# Patient Record
Sex: Female | Born: 1985 | Race: White | Hispanic: No | Marital: Single | Smoking: Current some day smoker
Health system: Southern US, Community
[De-identification: ages and names within clinical notes are randomized; demographics above are authoritative.]

## PROBLEM LIST (undated history)

## (undated) DIAGNOSIS — A6000 Herpesviral infection of urogenital system, unspecified: Secondary | ICD-10-CM

## (undated) DIAGNOSIS — R569 Unspecified convulsions: Secondary | ICD-10-CM

## (undated) DIAGNOSIS — A549 Gonococcal infection, unspecified: Secondary | ICD-10-CM

## (undated) DIAGNOSIS — J302 Other seasonal allergic rhinitis: Secondary | ICD-10-CM

## (undated) DIAGNOSIS — G510 Bell's palsy: Secondary | ICD-10-CM

## (undated) DIAGNOSIS — G40909 Epilepsy, unspecified, not intractable, without status epilepticus: Secondary | ICD-10-CM

## (undated) DIAGNOSIS — A749 Chlamydial infection, unspecified: Secondary | ICD-10-CM

## (undated) DIAGNOSIS — A630 Anogenital (venereal) warts: Secondary | ICD-10-CM

## (undated) HISTORY — DX: Other seasonal allergic rhinitis: J30.2

## (undated) HISTORY — PX: WISDOM TOOTH EXTRACTION: SHX21

## (undated) HISTORY — DX: Chlamydial infection, unspecified: A74.9

## (undated) HISTORY — DX: Bell's palsy: G51.0

## (undated) HISTORY — DX: Gonococcal infection, unspecified: A54.9

## (undated) HISTORY — DX: Anogenital (venereal) warts: A63.0

## (undated) HISTORY — DX: Herpesviral infection of urogenital system, unspecified: A60.00

---
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
## 2006-01-02 ENCOUNTER — Ambulatory Visit: Payer: Self-pay | Admitting: Psychiatry

## 2006-02-06 ENCOUNTER — Ambulatory Visit: Payer: Self-pay | Admitting: Psychiatry

## 2007-02-16 ENCOUNTER — Emergency Department: Payer: Self-pay | Admitting: Emergency Medicine

## 2008-06-05 IMAGING — CT CT HEAD WITHOUT CONTRAST
2 series · 16 of 30 positions shown, 20 images · non-contrast
Comparison: none

REASON FOR EXAM: facial weakness
COMMENTS:

PROCEDURE:     CT  - CT HEAD WITHOUT CONTRAST  - February 16, 2007 [DATE]
RESULT:      No acute intracranial abnormalities are identified.  There is
no evidence of mass lesion or hydrocephalus.  No evidence of hemorrhage.

[Series 2: without · axial · non-contrast · 0.39mm/px · z∈[-46,+74]mm · 13 of 29 slices shown, 17 images]
[im 3/29  brain]
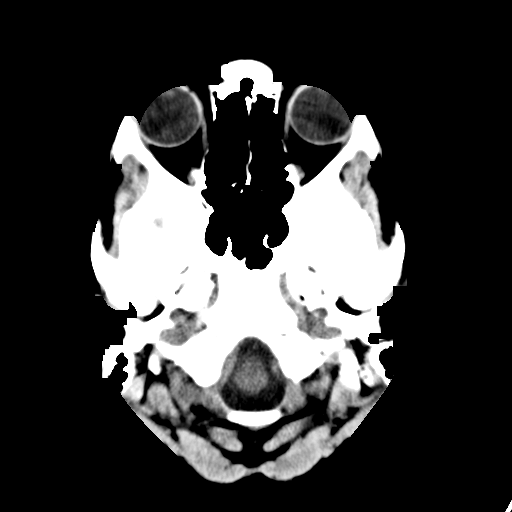
[im 3/29  bone]
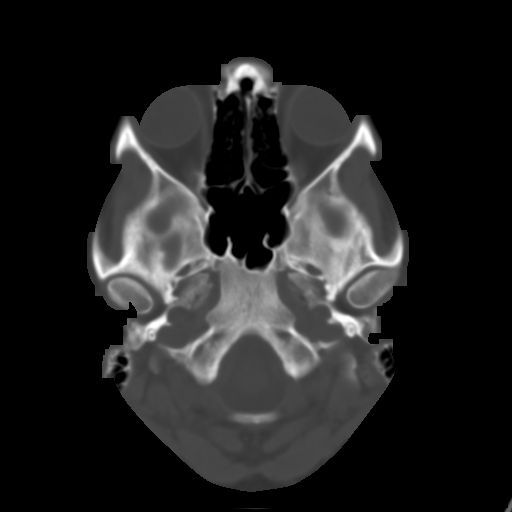
[im 5/29  brain]
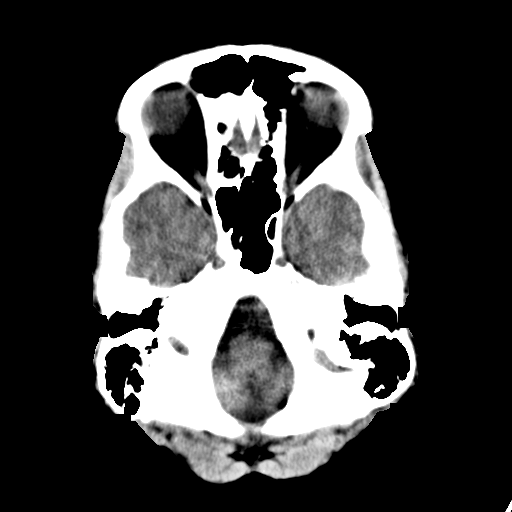
[im 7/29  brain]
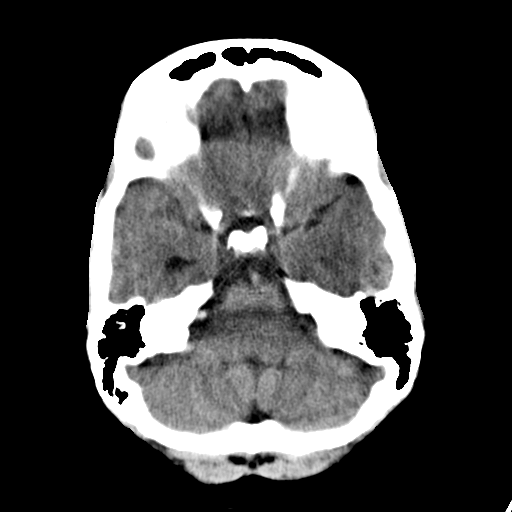
[im 9/29  brain]
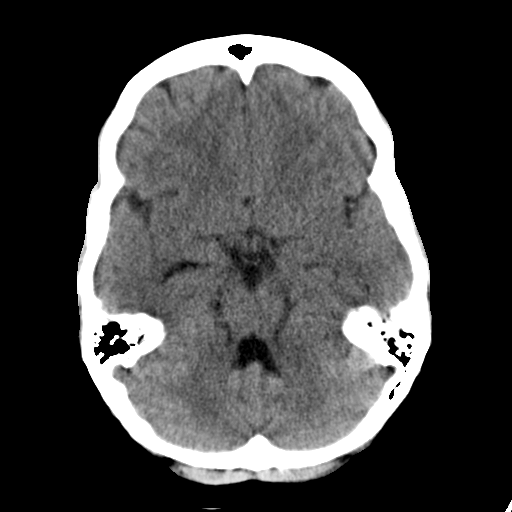
[im 11/29  brain]
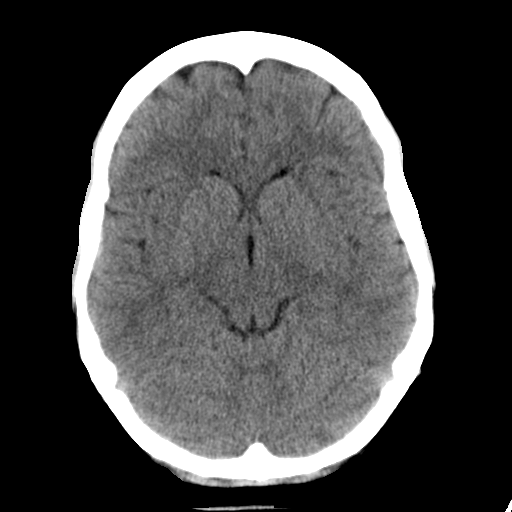
[im 11/29  bone]
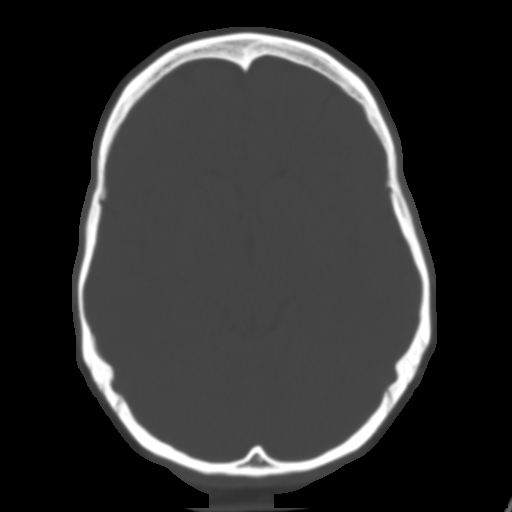
[im 13/29  brain]
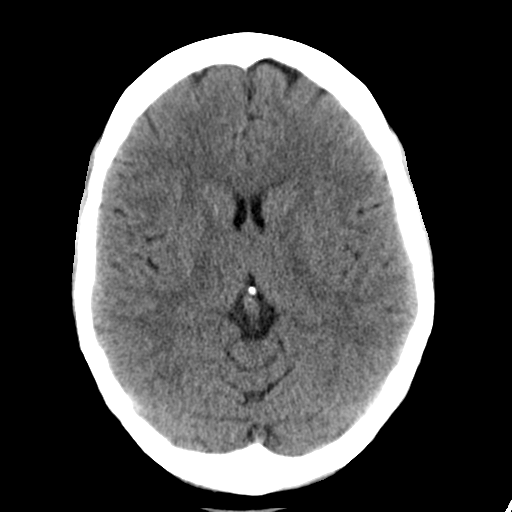
[im 15/29  brain]
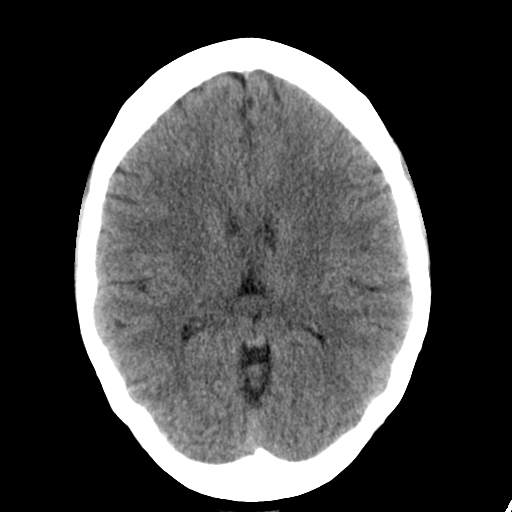
[im 17/29  brain]
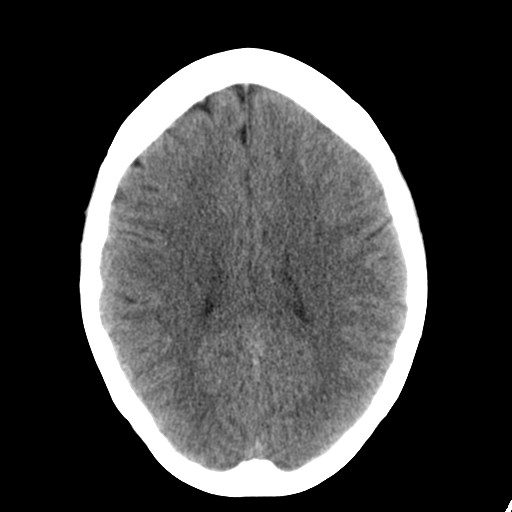
[im 19/29  brain]
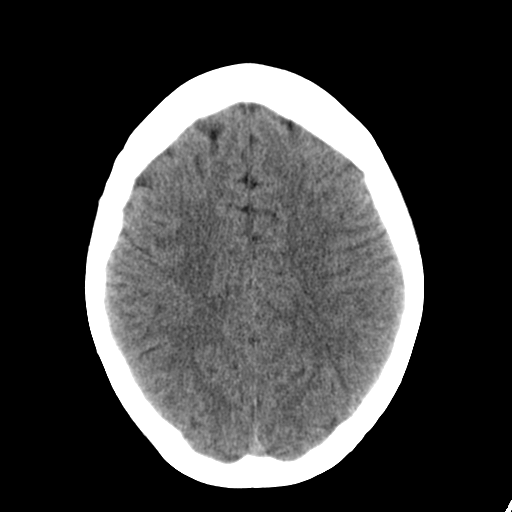
[im 19/29  bone]
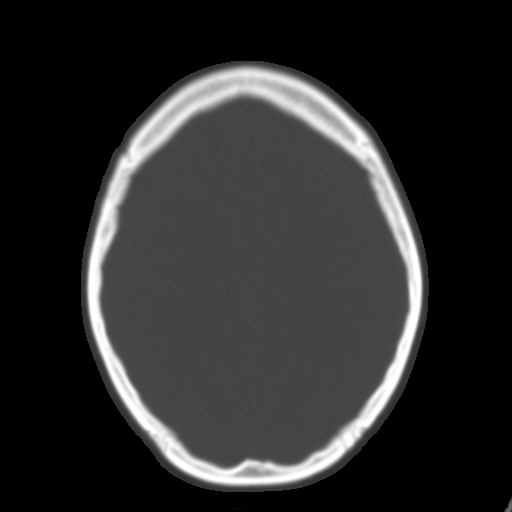
[im 21/29  brain]
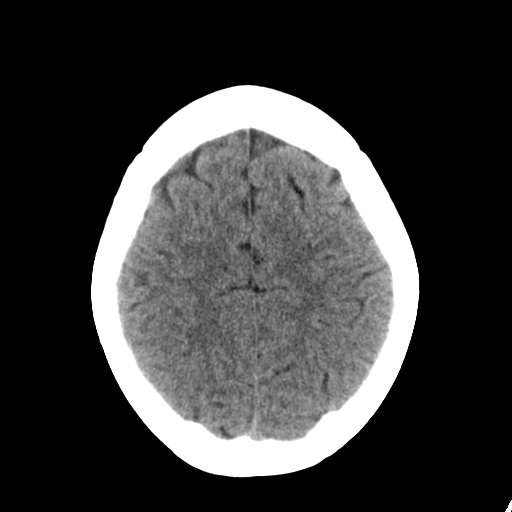
[im 23/29  brain]
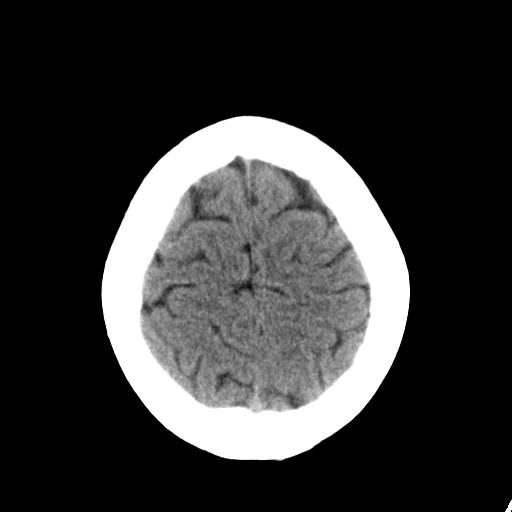
[im 25/29  brain]
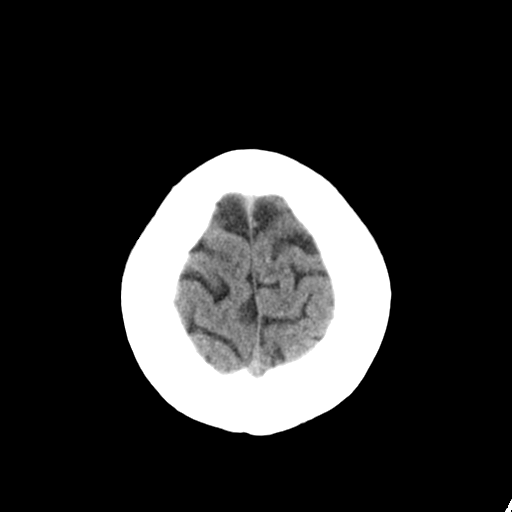
[im 27/29  brain]
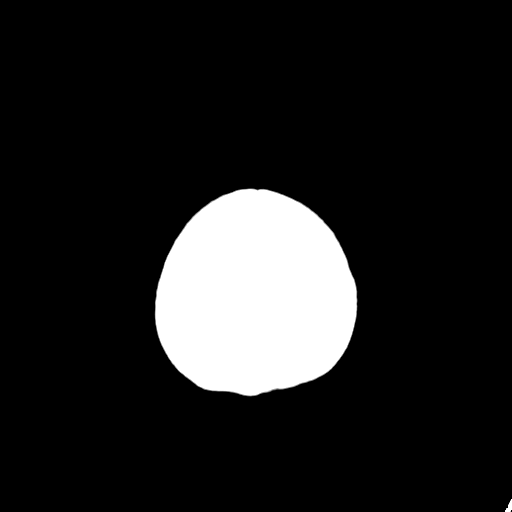
[im 27/29  bone]
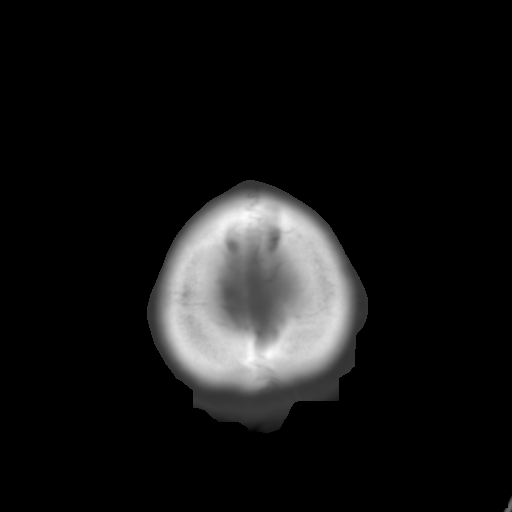

[Series 3: bone · axial · 0.39mm/px · z∈[-46,-6]mm · 3 of 29 slices shown]
[im 3/29  bone]
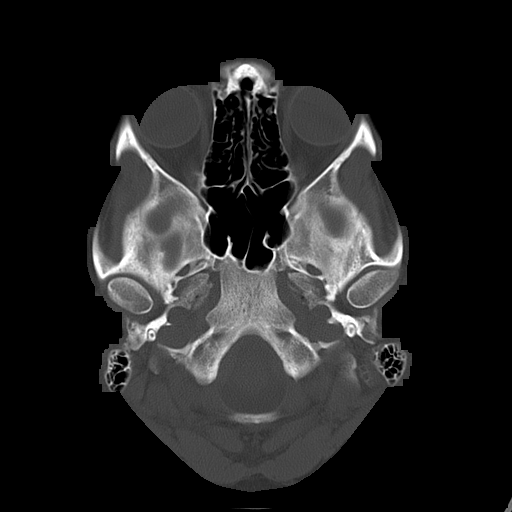
[im 7/29  bone]
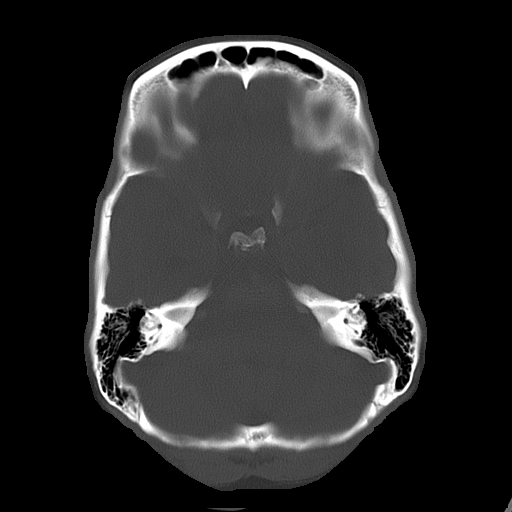
[im 11/29  bone]
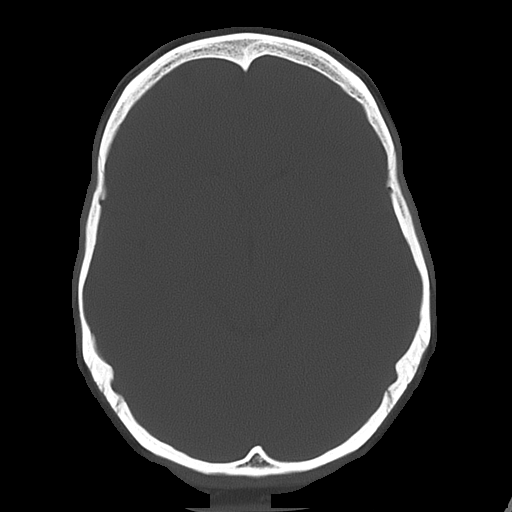

[16 of 30 positions shown; findings below may reference images not displayed]

IMPRESSION: No acute abnormalities.

## 2014-08-06 ENCOUNTER — Encounter (HOSPITAL_COMMUNITY): Payer: Self-pay | Admitting: Emergency Medicine

## 2014-08-06 ENCOUNTER — Emergency Department (HOSPITAL_COMMUNITY)
Admission: EM | Admit: 2014-08-06 | Discharge: 2014-08-07 | Disposition: A | Discharge status: Home / Self Care | Payer: Self-pay | Attending: Emergency Medicine | Admitting: Emergency Medicine

## 2014-08-06 DIAGNOSIS — G40909 Epilepsy, unspecified, not intractable, without status epilepticus: Secondary | ICD-10-CM | POA: Insufficient documentation

## 2014-08-06 DIAGNOSIS — R569 Unspecified convulsions: Secondary | ICD-10-CM | POA: Insufficient documentation

## 2014-08-06 DIAGNOSIS — F17200 Nicotine dependence, unspecified, uncomplicated: Secondary | ICD-10-CM | POA: Insufficient documentation

## 2014-08-06 DIAGNOSIS — F29 Unspecified psychosis not due to a substance or known physiological condition: Secondary | ICD-10-CM | POA: Insufficient documentation

## 2014-08-06 DIAGNOSIS — R51 Headache: Secondary | ICD-10-CM | POA: Insufficient documentation

## 2014-08-06 DIAGNOSIS — Z880 Allergy status to penicillin: Secondary | ICD-10-CM | POA: Insufficient documentation

## 2014-08-06 HISTORY — DX: Unspecified convulsions: R56.9

## 2014-08-06 HISTORY — DX: Epilepsy, unspecified, not intractable, without status epilepticus: G40.909

## 2014-08-06 LAB — BASIC METABOLIC PANEL
ANION GAP: 15 (ref 5–15)
BUN: 17 mg/dL (ref 6–23)
CO2: 23 mEq/L (ref 19–32)
Calcium: 9.7 mg/dL (ref 8.4–10.5)
Chloride: 101 mEq/L (ref 96–112)
Creatinine, Ser: 0.96 mg/dL (ref 0.50–1.10)
GFR, EST NON AFRICAN AMERICAN: 79 mL/min — AB (ref >90)
Glucose, Bld: 81 mg/dL (ref 70–99)
POTASSIUM: 4.7 meq/L (ref 3.7–5.3)
Sodium: 139 mEq/L (ref 137–147)

## 2014-08-06 MED ORDER — ACETAMINOPHEN 325 MG PO TABS
650.0000 mg | ORAL_TABLET | Freq: Once | ORAL | Status: AC
  Administered 2014-08-06: 650 mg via ORAL
  Filled 2014-08-06: qty 2

## 2014-08-06 NOTE — Discharge Instructions (Signed)
Please read and follow all provided instructions.  Your diagnoses today include:  1. Seizure     Tests performed today include:  Electrolytes, kidney function, and blood counts  Vital signs. See below for your results today.   Medications prescribed:   None  Take any prescribed medications only as directed.  Home care instructions:  Follow any educational materials contained in this packet.  You can not drive until cleared by a neurologist.   Follow-up instructions: Please follow-up with your primary care provider and your neurologist in the next week for further evaluation of your symptoms.   Return instructions:   Please return to the Emergency Department if you experience worsening symptoms.   Return with another seizure, severe worsening headache. Return if you have weakness in your arms or legs, slurred speech, trouble walking or talking, confusion, or trouble with your balance.   Please return if you have any other emergent concerns.  Additional Information:  Your vital signs today were: BP 97/67   Pulse 80   Temp(Src) 97.7 F (36.5 C) (Oral)   Resp 16   Ht 5\' 6"  (1.676 m)   Wt 153 lb (69.4 kg)   BMI 24.71 kg/m2   SpO2 100%   LMP 07/27/2014 If your blood pressure (BP) was elevated above 135/85 this visit, please have this repeated by your doctor within one month. --------------

## 2014-08-06 NOTE — ED Provider Notes (Signed)
CSN: [id number]     Arrival date & time 08/06/14  1912 History   First MD Initiated Contact with Patient 08/06/14 1955     Chief Complaint  Patient presents with  . Seizures     (Consider location/radiation/quality/duration/timing/severity/associated sxs/prior Treatment) HPI Comments: Patients with history of seizure in 2000 and again in 2004, antiepileptic use until approximately 2009 -- presents with probable seizure (at 6:45PM). These seizures were triggered by bright flashing lights. Patient was diagnosed in [place] until 2005 and subsequently followed in [place] until 2009. Patient was with her friends today when she became lightheaded and had tunnel vision. She lost consciousness. Per her friends, patient began to shake for about 30 seconds over her entire body. They lowered her safely to the ground and protected her from other injury. Patient woke up approximately 30 seconds later but was slower to respond and mildly confused for 10-15 minutes. No tongue biting or incontinence. She was brought immediately to the emergency department. Patient is currently on no medications. She drank approximately a glass and a half of wine today. She does not drink every day. She denies benzodiazepine use. She has had chronic headaches recently that she attributes to stress. No nausea or vomiting. No fever or neck pain. No treatments prior to arrival. Patient feels back to her baseline.  Patient is a 28 y.o. female presenting with seizures. The history is provided by the patient.  Seizures   Past Medical History  Diagnosis Date  . Seizures   . Epilepsy 2004    In [place]   Past Surgical History  Procedure Laterality Date  . Wisdom tooth extraction     History reviewed. No pertinent family history. History  Substance Use Topics  . Smoking status: Current Every Day Smoker  . Smokeless tobacco: Not on file  . Alcohol Use: Yes   OB History   Grav Para Term Preterm Abortions TAB SAB Ect Mult Living               Review of Systems  Constitutional: Negative for fatigue.  HENT: Negative for tinnitus.   Eyes: Negative for photophobia, pain and visual disturbance.  Respiratory: Negative for shortness of breath.   Cardiovascular: Negative for chest pain.  Gastrointestinal: Negative for nausea and vomiting.  Musculoskeletal: Negative for back pain, gait problem and neck pain.  Skin: Negative for wound.  Neurological: Positive for seizures and headaches. Negative for dizziness, weakness, light-headedness and numbness.  Psychiatric/Behavioral: Positive for confusion. Negative for decreased concentration.   Allergies  Penicillins  Home Medications   Prior to Admission medications   Not on File   BP 97/67  Pulse 80  Temp(Src) 97.7 F (36.5 C) (Oral)  Resp 16  Ht 5\' 6"  (1.676 m)  Wt 153 lb (69.4 kg)  BMI 24.71 kg/m2  SpO2 100%  LMP 07/27/2014  Physical Exam  Nursing note and vitals reviewed. Constitutional: She is oriented to person, place, and time. She appears well-developed and well-nourished.  HENT:  Head: Normocephalic and atraumatic.  Right Ear: Tympanic membrane, external ear and ear canal normal.  Left Ear: Tympanic membrane, external ear and ear canal normal.  Nose: Nose normal.  Mouth/Throat: Uvula is midline, oropharynx is clear and moist and mucous membranes are normal.  Eyes: Conjunctivae, EOM and lids are normal. Pupils are equal, round, and reactive to light. Right eye exhibits no nystagmus. Left eye exhibits no nystagmus.  Neck: Normal range of motion. Neck supple.  Cardiovascular: Normal rate and regular rhythm.  No murmur heard. Pulmonary/Chest: Effort normal and breath sounds normal. No respiratory distress. She has no wheezes. She has no rales.  Abdominal: Soft. There is no tenderness. There is no rebound and no guarding.  Musculoskeletal: Normal range of motion. She exhibits no edema and no tenderness.       Cervical back: She exhibits normal range of  motion, no tenderness and no bony tenderness.  Neurological: She is alert and oriented to person, place, and time. She has normal strength and normal reflexes. No cranial nerve deficit or sensory deficit. She displays a negative Romberg sign. Coordination and gait normal. GCS eye subscore is 4. GCS verbal subscore is 5. GCS motor subscore is 6.  Skin: Skin is warm and dry.  Psychiatric: She has a normal mood and affect.    ED Course  Procedures (including critical care time) Labs Review Labs Reviewed  BASIC METABOLIC PANEL - Abnormal; Notable for the following:    GFR calc non Af Amer 79 (*)    All other components within normal limits    Imaging Review No results found.   EKG Interpretation None      8:21 PM Patient seen and examined. Work-up initiated. Medications ordered. Patient was discussed with Elwin Mocha, MD   Vital signs reviewed and are as follows: BP 97/67  Pulse 80  Temp(Src) 97.7 F (36.5 C) (Oral)  Resp 16  Ht 5\' 6"  (1.676 m)  Wt 153 lb (69.4 kg)  BMI 24.71 kg/m2  SpO2 100%  LMP 07/27/2014  10:12 PM Patient stable. No further seizure activity. Discussed driving restrictions and that she should not drive.   Patient will followup with urologist. She has one in [place], [place] neuro referral given as well.  Patient urged to return with further seizure activity. Patient counseled to return if they have weakness in their arms or legs, worsening severe HA, slurred speech, trouble walking or talking, confusion, trouble with their balance, or if they have any other concerns. Patient verbalizes understanding and agrees with plan.   Will not start on antiepileptics given isolated seizure.    MDM   Final diagnoses:  Seizure   Pt with history of seizure -- however none for about 10 years. Her history today is consistent with seizure. She was postictal. Normal neuro exam. Electrolytes normal. Will defer further work-up and treatment to neurologist. Do not feel  emergent neuro consultation indicated tonight.   No dangerous or life-threatening conditions suspected or identified by history, physical exam, and by work-up. No indications for hospitalization identified.      Renne Crigler, PA-C 08/06/14 2217

## 2014-08-06 NOTE — ED Provider Notes (Signed)
Medical screening examination/treatment/procedure(s) were performed by non-physician practitioner and as supervising physician I was immediately available for consultation/collaboration.   EKG Interpretation None        Harrison Paulson, MD 08/06/14 2352 

## 2014-08-06 NOTE — ED Notes (Signed)
Per pt and her friends they were downtown when pt states she got lightheaded, began to have tunnel vision and got very weak.  Her friends helped her to the ground.  They stated she was shaking all over for approximately 30 seconds, for 15-20 seconds after pt's eyes were glazed and she was slurring her speech.  Pt is now speaking in full sentences, is A&O x4.  Pt has hx of seizures, no official dx of epilepsy.  Pt has not been taking seizure medication in greater than five years.

## 2018-02-17 ENCOUNTER — Ambulatory Visit (INDEPENDENT_AMBULATORY_CARE_PROVIDER_SITE_OTHER): Payer: BLUE CROSS/BLUE SHIELD | Admitting: Obstetrics and Gynecology

## 2018-02-17 ENCOUNTER — Encounter: Payer: Self-pay | Admitting: Obstetrics and Gynecology

## 2018-02-17 VITALS — BP 120/82 | HR 94 | Ht 66.0 in | Wt 167.0 lb

## 2018-02-17 DIAGNOSIS — T192XXA Foreign body in vulva and vagina, initial encounter: Secondary | ICD-10-CM

## 2018-02-17 NOTE — Progress Notes (Signed)
Chief Complaint  Patient presents with  . Foreign Body in Vagina    only in since last night 02/16/18    HPI:      Ms. Lori Doyle is a 32 y.o. G0P0000 who LMP was Patient's last menstrual period was 02/14/2018 (exact date)., presents today for NP eval of retained tampon. She put it in last night and when removing it this morning, the string came out but the tampon stayed inside. Pt couldn't remove it herself. Has long hx of irreg menses, dysmen and heavy flow. Hasn't had insurance till now for exams/mgmt. Hx of CIN2 per pt report.  FH endometriosis.   Past Medical History:  Diagnosis Date  . Epilepsy (HCC) 2004   [place]  . Seizures (HCC)     Past Surgical History:  Procedure Laterality Date  . WISDOM TOOTH EXTRACTION      History reviewed. No pertinent family history.  Social History   Socioeconomic History  . Marital status: Single    Spouse name: Not on file  . Number of children: Not on file  . Years of education: Not on file  . Highest education level: Not on file  Social Needs  . Financial resource strain: Not on file  . Food insecurity - worry: Not on file  . Food insecurity - inability: Not on file  . Transportation needs - medical: Not on file  . Transportation needs - non-medical: Not on file  Occupational History  . Not on file  Tobacco Use  . Smoking status: Current Every Day Smoker  . Smokeless tobacco: Never Used  Substance and Sexual Activity  . Alcohol use: Yes  . Drug use: Yes    Types: Marijuana  . Sexual activity: Not Currently    Birth control/protection: None  Other Topics Concern  . Not on file  Social History Narrative  . Not on file     Current Outpatient Medications:  .  cetirizine (ZYRTEC) 5 MG tablet, Take 5 mg by mouth daily., Disp: , Rfl:  .  Probiotic Product (PROBIOTIC DAILY PO), Take 1 capsule by mouth every morning., Disp: , Rfl:    ROS:  Review of Systems  Constitutional: Negative for fever.  Gastrointestinal:  Negative for blood in stool, constipation, diarrhea, nausea and vomiting.  Genitourinary: Negative for dyspareunia, dysuria, flank pain, frequency, hematuria, urgency, vaginal bleeding, vaginal discharge and vaginal pain.  Musculoskeletal: Negative for back pain.  Skin: Negative for rash.     OBJECTIVE:   Vitals:  BP 120/82   Pulse 94   Ht 5\' 6"  (1.676 m)   Wt 167 lb (75.8 kg)   LMP 02/14/2018 (Exact Date)   BMI 26.95 kg/m   Physical Exam  Constitutional: She is oriented to person, place, and time and well-developed, well-nourished, and in no distress. Vital signs are normal.  Pulmonary/Chest: Effort normal.  Genitourinary: Vagina normal, uterus normal, cervix normal, right adnexa normal, left adnexa normal and vulva normal. Uterus is not enlarged. Cervix exhibits no motion tenderness and no tenderness. Right adnexum displays no mass and no tenderness. Left adnexum displays no mass and no tenderness. Vulva exhibits no erythema, no exudate, no lesion, no rash and no tenderness. Vagina exhibits no lesion.  Genitourinary Comments: TAMPON IN VAGINA; REMOVED EASILY  Musculoskeletal: Normal range of motion.  Neurological: She is oriented to person, place, and time.  Psychiatric: Memory, affect and judgment normal.  Vitals reviewed.   Assessment/Plan: Retained tampon, initial encounter - Removed successfully. TSS sx discussed.  RTO for annual.     Return if symptoms worsen or fail to improve, for annual.  Alicia B. Copland, PA-C 02/17/2018 7:11 PM

## 2018-02-17 NOTE — Patient Instructions (Signed)
I value your feedback and entrusting us with your care. If you get a [facility] patient survey, I would appreciate you taking the time to let us know about your experience today. Thank you! 

## 2018-03-03 ENCOUNTER — Encounter: Payer: Self-pay | Admitting: Obstetrics and Gynecology

## 2018-03-03 ENCOUNTER — Ambulatory Visit (INDEPENDENT_AMBULATORY_CARE_PROVIDER_SITE_OTHER): Payer: BLUE CROSS/BLUE SHIELD | Admitting: Obstetrics and Gynecology

## 2018-03-03 VITALS — BP 120/80 | HR 113 | Ht 65.5 in | Wt 167.0 lb

## 2018-03-03 DIAGNOSIS — A6004 Herpesviral vulvovaginitis: Secondary | ICD-10-CM | POA: Diagnosis not present

## 2018-03-03 DIAGNOSIS — N926 Irregular menstruation, unspecified: Secondary | ICD-10-CM | POA: Diagnosis not present

## 2018-03-03 DIAGNOSIS — Z30011 Encounter for initial prescription of contraceptive pills: Secondary | ICD-10-CM | POA: Diagnosis not present

## 2018-03-03 DIAGNOSIS — J302 Other seasonal allergic rhinitis: Secondary | ICD-10-CM | POA: Diagnosis not present

## 2018-03-03 DIAGNOSIS — Z124 Encounter for screening for malignant neoplasm of cervix: Secondary | ICD-10-CM | POA: Diagnosis not present

## 2018-03-03 DIAGNOSIS — Z113 Encounter for screening for infections with a predominantly sexual mode of transmission: Secondary | ICD-10-CM | POA: Diagnosis not present

## 2018-03-03 DIAGNOSIS — Z1151 Encounter for screening for human papillomavirus (HPV): Secondary | ICD-10-CM

## 2018-03-03 DIAGNOSIS — A6000 Herpesviral infection of urogenital system, unspecified: Secondary | ICD-10-CM | POA: Insufficient documentation

## 2018-03-03 DIAGNOSIS — Z01419 Encounter for gynecological examination (general) (routine) without abnormal findings: Secondary | ICD-10-CM | POA: Diagnosis not present

## 2018-03-03 MED ORDER — NORETHIN-ETH ESTRAD-FE BIPHAS 1 MG-10 MCG / 10 MCG PO TABS: 1.0000 | ORAL_TABLET | Freq: Every day | ORAL | 3 refills | Status: DC

## 2018-03-03 MED ORDER — VALACYCLOVIR HCL 500 MG PO TABS: 500.0000 mg | ORAL_TABLET | Freq: Every day | ORAL | 3 refills | Status: DC

## 2018-03-03 NOTE — Patient Instructions (Signed)
I value your feedback and entrusting us with your care. If you get a [facility] patient survey, I would appreciate you taking the time to let us know about your experience today. Thank you! 

## 2018-03-03 NOTE — Progress Notes (Signed)
PCP:  Patient, No Pcp Per   Chief Complaint  Patient presents with  . Gynecologic Exam     HPI:      Ms. Lori Doyle is a 32 y.o. G0P0000 who LMP was Patient's last menstrual period was 02/14/2018 (exact date)., presents today for her annual examination.  Her menses are Q4-6 wks, lasting 4 days.  Dysmenorrhea moderate, occurring premenstrually, somewhat improved with NSAIDs. She does have random intermenstrual bleeding, "gushes" lasting 1-2 days. Has had sx since menarche, regulated with OCPs in past but had to stop when started seizure med. No recent seizures, no longer on meds. Would like BC again to regulate.  Sex activity: not sexually active.  Last Pap: November 22, 2014  Results were: no abnormalities /neg HPV DNA. Hx of CIN2 in distant past. Hx of STDs: GC, chlamydia, HSV--has regular outbreaks, no longer has valtrex Rx; HPV  There is no FH of breast cancer. There is no FH of ovarian cancer. The patient does not do self-breast exams.  Tobacco use: occas cig use Alcohol use: none No drug use.  Exercise: not active  She does not get adequate calcium and Vitamin D in her diet.   Past Medical History:  Diagnosis Date  . Bell's palsy   . Chlamydia   . Epilepsy (HCC) 2004   [place]  . Genital herpes   . Genital warts   . Gonorrhea   . Seasonal allergies   . Seizures (HCC)     Past Surgical History:  Procedure Laterality Date  . WISDOM TOOTH EXTRACTION      History reviewed. No pertinent family history.  Social History   Socioeconomic History  . Marital status: Single    Spouse name: Not on file  . Number of children: Not on file  . Years of education: Not on file  . Highest education level: Not on file  Social Needs  . Financial resource strain: Not on file  . Food insecurity - worry: Not on file  . Food insecurity - inability: Not on file  . Transportation needs - medical: Not on file  . Transportation needs - non-medical: Not on file  Occupational  History  . Not on file  Tobacco Use  . Smoking status: Current Every Day Smoker  . Smokeless tobacco: Never Used  Substance and Sexual Activity  . Alcohol use: Yes  . Drug use: Yes    Types: Marijuana  . Sexual activity: Not Currently    Birth control/protection: None  Other Topics Concern  . Not on file  Social History Narrative  . Not on file    Outpatient Medications Prior to Visit  Medication Sig Dispense Refill  . cetirizine (ZYRTEC) 5 MG tablet Take 5 mg by mouth daily.    . Probiotic Product (PROBIOTIC DAILY PO) Take 1 capsule by mouth every morning.     No facility-administered medications prior to visit.       ROS:  Review of Systems  Constitutional: Negative for fatigue, fever and unexpected weight change.  Respiratory: Negative for cough, shortness of breath and wheezing.   Cardiovascular: Negative for chest pain, palpitations and leg swelling.  Gastrointestinal: Negative for blood in stool, constipation, diarrhea, nausea and vomiting.  Endocrine: Negative for cold intolerance, heat intolerance and polyuria.  Genitourinary: Positive for menstrual problem. Negative for dyspareunia, dysuria, flank pain, frequency, genital sores, hematuria, pelvic pain, urgency, vaginal bleeding, vaginal discharge and vaginal pain.  Musculoskeletal: Negative for back pain, joint swelling and myalgias.  Skin: Negative for rash.  Neurological: Negative for dizziness, syncope, light-headedness, numbness and headaches.  Hematological: Negative for adenopathy.  Psychiatric/Behavioral: Negative for agitation, confusion, sleep disturbance and suicidal ideas. The patient is not nervous/anxious.    BREAST: No symptoms   Objective: BP 120/80   Pulse (!) 113   Ht 5' 5.5" (1.664 m)   Wt 167 lb (75.8 kg)   LMP 02/14/2018 (Exact Date)   BMI 27.37 kg/m    Physical Exam  Constitutional: She is oriented to person, place, and time. She appears well-developed and well-nourished.    Genitourinary: Vagina normal and uterus normal. There is no rash or tenderness on the right labia. There is no rash or tenderness on the left labia. No erythema or tenderness in the vagina. No vaginal discharge found. Right adnexum does not display mass and does not display tenderness. Left adnexum does not display mass and does not display tenderness. Cervix does not exhibit motion tenderness or polyp. Uterus is not enlarged or tender.  Neck: Normal range of motion. No thyromegaly present.  Cardiovascular: Normal rate, regular rhythm and normal heart sounds.  No murmur heard. Pulmonary/Chest: Effort normal and breath sounds normal. Right breast exhibits no mass, no nipple discharge, no skin change and no tenderness. Left breast exhibits no mass, no nipple discharge, no skin change and no tenderness.  Abdominal: Soft. There is no tenderness. There is no guarding.  Musculoskeletal: Normal range of motion.  Neurological: She is alert and oriented to person, place, and time. No cranial nerve deficit.  Psychiatric: She has a normal mood and affect. Her behavior is normal.  Vitals reviewed.   Assessment/Plan: Encounter for annual routine gynecological examination  Cervical cancer screening - Plan: IGP,CtNgTv,Apt HPV,rfx16/18,45, CANCELED: IGP, Aptima HPV  Screening for HPV (human papillomavirus) - Plan: IGP,CtNgTv,Apt HPV,rfx16/18,45, CANCELED: IGP, Aptima HPV  Screening for STD (sexually transmitted disease) - Plan: IGP,CtNgTv,Apt HPV,rfx16/18,45  Encounter for initial prescription of contraceptive pills - BC options discussed. OCP start with next menses. Rx Lo Loestrin. Condoms.  - Plan: Norethindrone-Ethinyl Estradiol-Fe Biphas (LO LOESTRIN FE) 1 MG-10 MCG / 10 MCG tablet  Irregular menses - Hx since menarche. OCP start. F/u with sx via phone in 3 months/sooner prn. - Plan: Norethindrone-Ethinyl Estradiol-Fe Biphas (LO LOESTRIN FE) 1 MG-10 MCG / 10 MCG tablet  Herpes simplex vulvovaginitis -  Try valtrex daily instead of prn. Rx eRxd. F/u prn.  - Plan: valACYclovir (VALTREX) 500 MG tablet  Seasonal allergies  Meds ordered this encounter  Medications  . valACYclovir (VALTREX) 500 MG tablet    Sig: Take 1 tablet (500 mg total) by mouth daily.    Dispense:  90 tablet    Refill:  3    Order Specific Question:   Supervising Provider    Answer:   Nadara MustardHARRIS, ROBERT P [id number]]  . Norethindrone-Ethinyl Estradiol-Fe Biphas (LO LOESTRIN FE) 1 MG-10 MCG / 10 MCG tablet    Sig: Take 1 tablet by mouth daily.    Dispense:  84 tablet    Refill:  3    Please use coupon card. BIN# [id number]; PCN# [id number]; RxGrp: [id number]; Rx ID# [id number]    Order Specific Question:   Supervising Provider    Answer:   Nadara MustardHARRIS, ROBERT P [[id number]]             GYN counsel family planning choices, adequate intake of calcium and vitamin D, diet and exercise     F/U  Return in about 1 year (around 03/04/2019).  Kristeena Meineke B. Fortunata Betty,  PA-C 03/03/2018 3:47 PM

## 2018-03-05 ENCOUNTER — Other Ambulatory Visit: Payer: Self-pay | Admitting: Obstetrics and Gynecology

## 2018-03-05 MED ORDER — NORETHIN ACE-ETH ESTRAD-FE 1-20 MG-MCG PO TABS: 1.0000 | ORAL_TABLET | Freq: Every day | ORAL | 3 refills | Status: DC

## 2018-03-05 NOTE — Progress Notes (Signed)
OCP change due to ins.

## 2018-03-06 LAB — IGP,CTNGTV,APT HPV,RFX16/18,45
Chlamydia, Nuc. Acid Amp: NEGATIVE
Gonococcus, Nuc. Acid Amp: NEGATIVE
HPV APTIMA: NEGATIVE
PAP Smear Comment: 0
TRICH VAG BY NAA: NEGATIVE

## 2018-03-10 ENCOUNTER — Encounter: Payer: Self-pay | Admitting: Obstetrics and Gynecology

## 2018-10-29 ENCOUNTER — Ambulatory Visit: Payer: Self-pay | Admitting: Obstetrics and Gynecology

## 2018-11-03 ENCOUNTER — Encounter: Payer: Self-pay | Admitting: Obstetrics and Gynecology

## 2018-11-03 ENCOUNTER — Ambulatory Visit (INDEPENDENT_AMBULATORY_CARE_PROVIDER_SITE_OTHER): Payer: BLUE CROSS/BLUE SHIELD | Admitting: Obstetrics and Gynecology

## 2018-11-03 VITALS — BP 118/70 | HR 90 | Ht 65.5 in | Wt 177.0 lb

## 2018-11-03 DIAGNOSIS — Z3041 Encounter for surveillance of contraceptive pills: Secondary | ICD-10-CM | POA: Diagnosis not present

## 2018-11-03 DIAGNOSIS — A6004 Herpesviral vulvovaginitis: Secondary | ICD-10-CM | POA: Diagnosis not present

## 2018-11-03 MED ORDER — NORGESTIMATE-ETH ESTRADIOL 0.25-35 MG-MCG PO TABS: 1.0000 | ORAL_TABLET | Freq: Every day | ORAL | 1 refills | Status: DC

## 2018-11-03 MED ORDER — VALACYCLOVIR HCL 500 MG PO TABS: 500.0000 mg | ORAL_TABLET | Freq: Every day | ORAL | 1 refills | Status: DC

## 2018-11-03 NOTE — Progress Notes (Signed)
Patient, No Pcp Per   Chief Complaint  Patient presents with  . Follow-up    Med follow up, has had 3 periods mid-pack, more appetitie/weight gain, fatigue, depression/mental behavior x 3-4 months ago    HPI:      Ms. Lori Doyle is a 32 y.o. [id number] who LMP was Patient's last menstrual period was 10/24/2018 (exact date)., presents today for OCP f/u. Started on junel 1/20 for cycle control/dysmen. Had been on OCPs (OTC in 2013) in past without any problems. Pt wanted to restart pills at 3/19 annual. Pt has been on them for 6 months. Having BTB (with and without late/missed pills) and emotional lability. Pt tearful all the time for no reason.  Also has 2-3 day period with bad cramping each month, improved with 1000 mg ibup dose.  Pt has been sex active only once, but not recently. Had bleeding with sex, too. Pt   3/19 appt "Her menses are Q4-6 wks, lasting 4 days. Dysmenorrhea moderate, occurring premenstrually, somewhat improved with NSAIDs. She does have random intermenstrual bleeding, "gushes" lasting 1-2 days. Has had sx since menarche, regulated with OCPs in past."  Past Medical History:  Diagnosis Date  . Bell's palsy   . Chlamydia   . Epilepsy (HCC) 2004   In [place]  . Genital herpes   . Genital warts   . Gonorrhea   . Seasonal allergies   . Seizures (HCC)     Past Surgical History:  Procedure Laterality Date  . WISDOM TOOTH EXTRACTION      Family History  Problem Relation Age of Onset  . Endometriosis Mother        Uterus    Social History   Socioeconomic History  . Marital status: Single    Spouse name: Not on file  . Number of children: Not on file  . Years of education: Not on file  . Highest education level: Not on file  Occupational History  . Not on file  Social Needs  . Financial resource strain: Not on file  . Food insecurity:    Worry: Not on file    Inability: Not on file  . Transportation needs:    Medical: Not on file    Non-medical: Not on  file  Tobacco Use  . Smoking status: Current Some Day Smoker  . Smokeless tobacco: Never Used  Substance and Sexual Activity  . Alcohol use: Yes  . Drug use: Yes    Types: Marijuana  . Sexual activity: Not Currently    Birth control/protection: Pill  Lifestyle  . Physical activity:    Days per week: Not on file    Minutes per session: Not on file  . Stress: Not on file  Relationships  . Social connections:    Talks on phone: Not on file    Gets together: Not on file    Attends religious service: Not on file    Active member of club or organization: Not on file    Attends meetings of clubs or organizations: Not on file    Relationship status: Not on file  . Intimate partner violence:    Fear of current or ex partner: Not on file    Emotionally abused: Not on file    Physically abused: Not on file    Forced sexual activity: Not on file  Other Topics Concern  . Not on file  Social History Narrative  . Not on file    Outpatient Medications Prior to  Visit  Medication Sig Dispense Refill  . cetirizine (ZYRTEC) 5 MG tablet Take 5 mg by mouth daily.    . Probiotic Product (PROBIOTIC DAILY PO) Take 1 capsule by mouth every morning.    . norethindrone-ethinyl estradiol (JUNEL FE 1/20) 1-20 MG-MCG tablet Take 1 tablet by mouth daily. 3 Package 3  . valACYclovir (VALTREX) 500 MG tablet Take 1 tablet (500 mg total) by mouth daily. (Patient not taking: Reported on 11/03/2018) 90 tablet 3   No facility-administered medications prior to visit.      ROS:  Review of Systems  Constitutional: Negative for fever.  Gastrointestinal: Negative for blood in stool, constipation, diarrhea, nausea and vomiting.  Genitourinary: Positive for menstrual problem. Negative for dyspareunia, dysuria, flank pain, frequency, hematuria, urgency, vaginal bleeding, vaginal discharge and vaginal pain.  Musculoskeletal: Negative for back pain.  Skin: Negative for rash.  Psychiatric/Behavioral: Positive for  agitation and dysphoric mood.    OBJECTIVE:   Vitals:  BP 118/70   Pulse 90   Ht 5' 5.5" (1.664 m)   Wt 177 lb (80.3 kg)   LMP 10/24/2018 (Exact Date)   BMI 29.01 kg/m   Physical Exam  Constitutional: She is oriented to person, place, and time. She appears well-developed.  Neck: Normal range of motion.  Pulmonary/Chest: Effort normal.  Musculoskeletal: Normal range of motion.  Neurological: She is alert and oriented to person, place, and time. No cranial nerve deficit.  Psychiatric: She has a normal mood and affect. Her behavior is normal. Judgment and thought content normal.  Vitals reviewed.   Assessment/Plan: Encounter for surveillance of contraceptive pills - Pt having BTB/emotional lability with junel 1/20. Change back to sprintec. Rx eRxd. F/u prn sx. - Plan: norgestimate-ethinyl estradiol (ORTHO-CYCLEN,SPRINTEC,PREVIFEM) 0.25-35 MG-MCG tablet  Herpes simplex vulvovaginitis - Rx valtrex RF. Already sent in 3/19 but pt states she needs RF.  F/u prn.  - Plan: valACYclovir (VALTREX) 500 MG tablet    Meds ordered this encounter  Medications  . norgestimate-ethinyl estradiol (ORTHO-CYCLEN,SPRINTEC,PREVIFEM) 0.25-35 MG-MCG tablet    Sig: Take 1 tablet by mouth daily.    Dispense:  84 tablet    Refill:  1    Order Specific Question:   Supervising Provider    Answer:   Nadara MustardHARRIS, ROBERT P [id number]]  . valACYclovir (VALTREX) 500 MG tablet    Sig: Take 1 tablet (500 mg total) by mouth daily.    Dispense:  90 tablet    Refill:  1    Order Specific Question:   Supervising Provider    Answer:   Nadara MustardHARRIS, ROBERT P [[id number]]      Return if symptoms worsen or fail to improve.   B. , PA-C 11/03/2018 5:14 PM

## 2018-11-03 NOTE — Patient Instructions (Signed)
I value your feedback and entrusting us with your care. If you get a [facility] patient survey, I would appreciate you taking the time to let us know about your experience today. Thank you! 

## 2018-11-07 ENCOUNTER — Telehealth: Payer: Self-pay | Admitting: Nurse Practitioner

## 2018-11-07 DIAGNOSIS — R102 Pelvic and perineal pain: Secondary | ICD-10-CM

## 2018-11-07 NOTE — Progress Notes (Signed)
Based on what you shared with me it looks like you have a serious condition that should be evaluated in a face to face office visit.  NOTE: If you entered your credit card information for this eVisit, you will not be charged. You may see a "hold" on your card for the $30 but that hold will drop off and you will not have a charge processed.  If you are having a true medical emergency please call 911.  If you need an urgent face to face visit, [facility] has four urgent care centers for your convenience.  If you need care fast and have a high deductible or no insurance consider:   [email or web address] to reserve your spot online an avoid wait times  [facility] [address], [address] [place], [place] [address] 8 am to 8 pm Monday-Friday 10 am to 4 pm Saturday-Sunday *Across the street from [employer]  [facility]  [address]  [place], [address] 8 am to 5 pm Monday-Friday * In the [facility]   The following sites will take your  insurance:  . [facility]  [phone] Get Driving Directions Find a Provider at this Location  [address] [place], [place] [address] . 10 am to 8 pm Monday-Friday . 12 pm to 8 pm Saturday-Sunday   . [facility]  [phone] Get Driving Directions Find a Provider at this Location  [address], [address] [place], [place] [address] . 8 am to 8 pm Monday-Friday . 9 am to 6 pm Saturday . 11 am to 6 pm Sunday   . [facility] [place]  [phone] Get Driving Directions  [address] [place], [place] [address] . 8 am to 8 pm Monday-Friday . 8 am to 4 pm Saturday-Sunday   Your e-visit answers were reviewed by a board certified advanced clinical practitioner to complete your personal care plan.  Thank you for using e-Visits.  

## 2018-11-09 ENCOUNTER — Telehealth: Payer: Self-pay

## 2018-11-09 ENCOUNTER — Ambulatory Visit (INDEPENDENT_AMBULATORY_CARE_PROVIDER_SITE_OTHER): Payer: BLUE CROSS/BLUE SHIELD | Admitting: Obstetrics and Gynecology

## 2018-11-09 ENCOUNTER — Encounter: Payer: Self-pay | Admitting: Obstetrics and Gynecology

## 2018-11-09 VITALS — BP 120/72 | Ht 65.5 in | Wt 175.0 lb

## 2018-11-09 DIAGNOSIS — M545 Low back pain, unspecified: Secondary | ICD-10-CM

## 2018-11-09 LAB — POCT URINALYSIS DIPSTICK
BILIRUBIN UA: NEGATIVE
Blood, UA: POSITIVE
Glucose, UA: NEGATIVE
NITRITE UA: NEGATIVE
Protein, UA: NEGATIVE
Spec Grav, UA: 1.025 (ref 1.010–1.025)
UROBILINOGEN UA: 0.2 U/dL
pH, UA: 6.5 (ref 5.0–8.0)

## 2018-11-09 NOTE — Progress Notes (Signed)
Patient ID: Lori DonningSarah E Doyle, female   DOB: 1986/03/09, 32 y.o.   MRN: [id number]  Reason for Consult: Vaginal Lump (Golf size lump in pelvic area, fever 100.0 on saturday, lower back pain )   Referred by No ref. provider found  Subjective:     HPI:  Lori DonningSarah E Doyle is a 32 y.o. female she presents today for a problem visit. She notes a small lump in her left groin which she noticed this weekend. It is tender to the touch. She has had an illness with a low grade fever, body aches, headache, and back pain. She was told this weekend to go to the ER by the on call nurse, but did not. She denies nasal congestion. She denies cough. She did not get a flu vaccine this year. She  Is more than 48 hours out from the start of her symptoms.   Denies vaginal discharge. Denies vaginal itching.  Reports that her back pain feels like shooting electricity down her legs. She has had similar pain before.  Past Medical History:  Diagnosis Date  . Bell's palsy   . Chlamydia   . Epilepsy (HCC) 2004   In [place]  . Genital herpes   . Genital warts   . Gonorrhea   . Seasonal allergies   . Seizures (HCC)    Family History  Problem Relation Age of Onset  . Endometriosis Mother        Uterus   Past Surgical History:  Procedure Laterality Date  . WISDOM TOOTH EXTRACTION      Short Social History:  Social History   Tobacco Use  . Smoking status: Current Some Day Smoker  . Smokeless tobacco: Never Used  Substance Use Topics  . Alcohol use: Yes    Allergies  Allergen Reactions  . Penicillins Hives    Current Outpatient Medications  Medication Sig Dispense Refill  . cetirizine (ZYRTEC) 5 MG tablet Take 5 mg by mouth daily.    . norgestimate-ethinyl estradiol (ORTHO-CYCLEN,SPRINTEC,PREVIFEM) 0.25-35 MG-MCG tablet Take 1 tablet by mouth daily. 84 tablet 1  . Probiotic Product (PROBIOTIC DAILY PO) Take 1 capsule by mouth every morning.    . valACYclovir (VALTREX) 500 MG tablet Take 1 tablet (500 mg  total) by mouth daily. (Patient not taking: Reported on 11/09/2018) 90 tablet 1   No current facility-administered medications for this visit.     Review of Systems  Constitutional: Negative for chills, fatigue, fever and unexpected weight change.  HENT: Negative for trouble swallowing.  Eyes: Negative for loss of vision.  Respiratory: Negative for cough, shortness of breath and wheezing.  Cardiovascular: Negative for chest pain, leg swelling, palpitations and syncope.  GI: Negative for abdominal pain, blood in stool, diarrhea, nausea and vomiting.  GU: Negative for difficulty urinating, dysuria, frequency and hematuria.  Musculoskeletal: Positive for joint pain and myalgias. Negative for back pain and leg pain.  Skin: Negative for rash.  Neurological: Positive for headaches. Negative for dizziness, light-headedness, numbness and seizures.  Psychiatric: Negative for behavioral problem, confusion, depressed mood and sleep disturbance.        Objective:  Objective   Vitals:   11/09/18 1050  BP: 120/72  Weight: 175 lb (79.4 kg)  Height: 5' 5.5" (1.664 m)   Body mass index is 28.68 kg/m.  Physical Exam  Constitutional: She is oriented to person, place, and time. She appears well-developed and well-nourished.  HENT:  Head: Normocephalic and atraumatic.  Eyes: Pupils are equal, round, and reactive to  light. EOM are normal.  Cardiovascular: Normal rate, regular rhythm and normal heart sounds.  Pulmonary/Chest: Effort normal. No respiratory distress.  Abdominal: Soft. She exhibits no distension and no mass. There is no tenderness. There is no rebound. No hernia.  Musculoskeletal:       Lumbar back: She exhibits tenderness, pain and spasm. She exhibits no edema and no deformity.       Back:       Legs: Neurological: She is alert and oriented to person, place, and time.  Skin: Skin is warm and dry.  Psychiatric: She has a normal mood and affect. Her behavior is normal. Judgment  and thought content normal.  Nursing note and vitals reviewed.       Assessment/Plan:    Symptoms of a viral cold. More than 48 hours since the onset of symptoms. Advised to take tylenol, hydrate, and rest. If she desires a flu test this can be performed at an urgent care center.    Sciatic back pain, chronic problem. Patient given rehab exercises. She is scheduled to folllow up with orthopedics about this pain.   Lymph node non concerning, likely related to acute illness.  Urine culture sent. Will follow up results.  More than 15 minutes were spent face to face with the patient in the room with more than 50% of the time spent providing counseling and discussing the plan of management.   Adelene Idler MD [facility], [facility] 11/09/18 12:45 PM

## 2018-11-09 NOTE — Telephone Encounter (Signed)
Pt called after hour nurse 11/07/18 at 4:57 c/o back pain, lump in pelvic are sensitive to touch, pounding headaches, fever of 100.  She was switched to a diff bc.  [phone] After hour nurse adv pt to go to ED.  Pt did not go.  Doesn't feel as bad as she did but is planning to go to Urgent Care today as she doesn't have a PCP.  Gave pt option of being seen here instead.  Tx'd to [facility] to sched.

## 2018-11-09 NOTE — Patient Instructions (Signed)
Sciatica Rehab Ask your health care provider which exercises are safe for you. Do exercises exactly as told by your health care provider and adjust them as directed. It is normal to feel mild stretching, pulling, tightness, or discomfort as you do these exercises, but you should stop right away if you feel sudden pain or your pain gets worse.Do not begin these exercises until told by your health care provider. Stretching and range of motion exercises These exercises warm up your muscles and joints and improve the movement and flexibility of your hips and your back. These exercises also help to relieve pain, numbness, and tingling. Exercise A: Sciatic nerve glide 1. Sit in a chair with your head facing down toward your chest. Place your hands behind your back. Let your shoulders slump forward. 2. Slowly straighten one of your knees while you tilt your head back as if you are looking toward the ceiling. Only straighten your leg as far as you can without making your symptoms worse. 3. Hold for __________ seconds. 4. Slowly return to the starting position. 5. Repeat with your other leg. Repeat __________ times. Complete this exercise __________ times a day. Exercise B: Knee to chest with hip adduction and internal rotation  1. Lie on your back on a firm surface with both legs straight. 2. Bend one of your knees and move it up toward your chest until you feel a gentle stretch in your lower back and buttock. Then, move your knee toward the shoulder that is on the opposite side from your leg. ? Hold your leg in this position by holding onto the front of your knee. 3. Hold for __________ seconds. 4. Slowly return to the starting position. 5. Repeat with your other leg. Repeat __________ times. Complete this exercise __________ times a day. Exercise C: Prone extension on elbows  1. Lie on your abdomen on a firm surface. A bed may be too soft for this exercise. 2. Prop yourself up on your  elbows. 3. Use your arms to help lift your chest up until you feel a gentle stretch in your abdomen and your lower back. ? This will place some of your body weight on your elbows. If this is uncomfortable, try stacking pillows under your chest. ? Your hips should stay down, against the surface that you are lying on. Keep your hip and back muscles relaxed. 4. Hold for __________ seconds. 5. Slowly relax your upper body and return to the starting position. Repeat __________ times. Complete this exercise __________ times a day. Strengthening exercises These exercises build strength and endurance in your back. Endurance is the ability to use your muscles for a long time, even after they get tired. Exercise D: Pelvic tilt 1. Lie on your back on a firm surface. Bend your knees and keep your feet flat. 2. Tense your abdominal muscles. Tip your pelvis up toward the ceiling and flatten your lower back into the floor. ? To help with this exercise, you may place a small towel under your lower back and try to push your back into the towel. 3. Hold for __________ seconds. 4. Let your muscles relax completely before you repeat this exercise. Repeat __________ times. Complete this exercise __________ times a day. Exercise E: Alternating arm and leg raises  1. Get on your hands and knees on a firm surface. If you are on a hard floor, you may want to use padding to cushion your knees, such as an exercise mat. 2. Line up your arms   and legs. Your hands should be below your shoulders, and your knees should be below your hips. 3. Lift your left leg behind you. At the same time, raise your right arm and straighten it in front of you. ? Do not lift your leg higher than your hip. ? Do not lift your arm higher than your shoulder. ? Keep your abdominal and back muscles tight. ? Keep your hips facing the ground. ? Do not arch your back. ? Keep your balance carefully, and do not hold your breath. 4. Hold for  __________ seconds. 5. Slowly return to the starting position and repeat with your right leg and your left arm. Repeat __________ times. Complete this exercise __________ times a day. Posture and body mechanics  Body mechanics refers to the movements and positions of your body while you do your daily activities. Posture is part of body mechanics. Good posture and healthy body mechanics can help to relieve stress in your body's tissues and joints. Good posture means that your spine is in its natural S-curve position (your spine is neutral), your shoulders are pulled back slightly, and your head is not tipped forward. The following are general guidelines for applying improved posture and body mechanics to your everyday activities. Standing   When standing, keep your spine neutral and your feet about hip-width apart. Keep a slight bend in your knees. Your ears, shoulders, and hips should line up.  When you do a task in which you stand in one place for a long time, place one foot up on a stable object that is 2-4 inches (5-10 cm) high, such as a footstool. This helps keep your spine neutral. Sitting   When sitting, keep your spine neutral and keep your feet flat on the floor. Use a footrest, if necessary, and keep your thighs parallel to the floor. Avoid rounding your shoulders, and avoid tilting your head forward.  When working at a desk or a computer, keep your desk at a height where your hands are slightly lower than your elbows. Slide your chair under your desk so you are close enough to maintain good posture.  When working at a computer, place your monitor at a height where you are looking straight ahead and you do not have to tilt your head forward or downward to look at the screen. Resting   When lying down and resting, avoid positions that are most painful for you.  If you have pain with activities such as sitting, bending, stooping, or squatting (flexion-based activities), lie in a  position in which your body does not bend very much. For example, avoid curling up on your side with your arms and knees near your chest (fetal position).  If you have pain with activities such as standing for a long time or reaching with your arms (extension-based activities), lie with your spine in a neutral position and bend your knees slightly. Try the following positions: ? Lying on your side with a pillow between your knees. ? Lying on your back with a pillow under your knees. Lifting   When lifting objects, keep your feet at least shoulder-width apart and tighten your abdominal muscles.  Bend your knees and hips and keep your spine neutral. It is important to lift using the strength of your legs, not your back. Do not lock your knees straight out.  Always ask for help to lift heavy or awkward objects. This information is not intended to replace advice given to you by your health care   provider. Make sure you discuss any questions you have with your health care provider. Document Released: 12/02/2005 Document Revised: 08/08/2016 Document Reviewed: 08/18/2015 Elsevier Interactive Patient Education  2018 [employer]  Sciatica Sciatica is pain, numbness, weakness, or tingling along your sciatic nerve. The sciatic nerve starts in the lower back and goes down the back of each leg. Sciatica happens when this nerve is pinched or has pressure put on it. Sciatica usually goes away on its own or with treatment. Sometimes, sciatica may keep coming back (recur). Follow these instructions at home: Medicines  Take over-the-counter and prescription medicines only as told by your doctor.  Do not drive or use heavy machinery while taking prescription pain medicine. Managing pain  If directed, put ice on the affected area. ? Put ice in a plastic bag. ? Place a towel between your skin and the bag. ? Leave the ice on for 20 minutes, 2-3 times a day.  After icing, apply heat to the affected area  before you exercise or as often as told by your doctor. Use the heat source that your doctor tells you to use, such as a moist heat pack or a heating pad. ? Place a towel between your skin and the heat source. ? Leave the heat on for 20-30 minutes. ? Remove the heat if your skin turns bright red. This is especially important if you are unable to feel pain, heat, or cold. You may have a greater risk of getting burned. Activity  Return to your normal activities as told by your doctor. Ask your doctor what activities are safe for you. ? Avoid activities that make your sciatica worse.  Take short rests during the day. Rest in a lying or standing position. This is usually better than sitting to rest. ? When you rest for a long time, do some physical activity or stretching between periods of rest. ? Avoid sitting for a long time without moving. Get up and move around at least one time each hour.  Exercise and stretch regularly, as told by your doctor.  Do not lift anything that is heavier than 10 lb (4.5 kg) while you have symptoms of sciatica. ? Avoid lifting heavy things even when you do not have symptoms. ? Avoid lifting heavy things over and over.  When you lift objects, always lift in a way that is safe for your body. To do this, you should: ? Bend your knees. ? Keep the object close to your body. ? Avoid twisting. General instructions  Use good posture. ? Avoid leaning forward when you are sitting. ? Avoid hunching over when you are standing.  Stay at a healthy weight.  Wear comfortable shoes that support your feet. Avoid wearing high heels.  Avoid sleeping on a mattress that is too soft or too hard. You might have less pain if you sleep on a mattress that is firm enough to support your back.  Keep all follow-up visits as told by your doctor. This is important. Contact a doctor if:  You have pain that: ? Wakes you up when you are sleeping. ? Gets worse when you lie down. ? Is  worse than the pain you have had in the past. ? Lasts longer than 4 weeks.  You lose weight for without trying. Get help right away if:  You cannot control when you pee (urinate) or poop (have a bowel movement).  You have weakness in any of these areas and it gets worse. ? Lower back. ?   Lower belly (pelvis). ? Butt (buttocks). ? Legs.  You have redness or swelling of your back.  You have a burning feeling when you pee. This information is not intended to replace advice given to you by your health care provider. Make sure you discuss any questions you have with your health care provider. Document Released: 09/10/2008 Document Revised: 05/09/2016 Document Reviewed: 08/11/2015 Elsevier Interactive Patient Education  2018 Elsevier Inc.  

## 2018-11-11 LAB — URINE CULTURE

## 2018-11-20 NOTE — Progress Notes (Signed)
Negative, Released to mychart 

## 2019-01-27 ENCOUNTER — Telehealth: Payer: Self-pay | Admitting: Nurse Practitioner

## 2019-01-27 DIAGNOSIS — N760 Acute vaginitis: Secondary | ICD-10-CM

## 2019-01-27 DIAGNOSIS — B9689 Other specified bacterial agents as the cause of diseases classified elsewhere: Secondary | ICD-10-CM

## 2019-01-27 MED ORDER — METRONIDAZOLE 500 MG PO TABS: 500.0000 mg | ORAL_TABLET | Freq: Two times a day (BID) | ORAL | 0 refills | Status: DC

## 2019-01-27 NOTE — Progress Notes (Signed)

## 2019-04-18 ENCOUNTER — Other Ambulatory Visit: Payer: Self-pay | Admitting: Obstetrics and Gynecology

## 2019-04-18 DIAGNOSIS — Z3041 Encounter for surveillance of contraceptive pills: Secondary | ICD-10-CM

## 2019-08-12 ENCOUNTER — Other Ambulatory Visit: Payer: Self-pay

## 2019-08-12 ENCOUNTER — Ambulatory Visit
Admission: EM | Admit: 2019-08-12 | Discharge: 2019-08-12 | Disposition: A | Discharge status: Home / Self Care | Payer: BLUE CROSS/BLUE SHIELD | Attending: Urgent Care | Admitting: Urgent Care

## 2019-08-12 ENCOUNTER — Encounter: Payer: Self-pay | Admitting: Emergency Medicine

## 2019-08-12 DIAGNOSIS — Z7189 Other specified counseling: Secondary | ICD-10-CM

## 2019-08-12 DIAGNOSIS — R112 Nausea with vomiting, unspecified: Secondary | ICD-10-CM

## 2019-08-12 DIAGNOSIS — B349 Viral infection, unspecified: Secondary | ICD-10-CM

## 2019-08-12 DIAGNOSIS — J029 Acute pharyngitis, unspecified: Secondary | ICD-10-CM | POA: Diagnosis not present

## 2019-08-12 DIAGNOSIS — R05 Cough: Secondary | ICD-10-CM | POA: Diagnosis not present

## 2019-08-12 LAB — RAPID STREP SCREEN (MED CTR MEBANE ONLY): Streptococcus, Group A Screen (Direct): NEGATIVE

## 2019-08-12 MED ORDER — BENZONATATE 100 MG PO CAPS: 100.0000 mg | ORAL_CAPSULE | Freq: Three times a day (TID) | ORAL | 0 refills | Status: DC

## 2019-08-12 MED ORDER — ONDANSETRON 4 MG PO TBDP: 4.0000 mg | ORAL_TABLET | Freq: Three times a day (TID) | ORAL | 0 refills | Status: DC | PRN

## 2019-08-12 NOTE — ED Triage Notes (Signed)
Pt c/o diarrhea, headache, cough, sore throat and vomiting. Cough started about 4 days ago and other symptoms started yesterday.

## 2019-08-12 NOTE — ED Provider Notes (Signed)
[place], [employer]   Name: Lori Doyle DOB: 01/11/1986 MRN: [id number] CSN: [id number] PCP: Patient, No Pcp Per  Arrival date and time:  08/12/19 1449  Chief Complaint:  Nausea and Cough   NOTE: Prior to seeing the patient today, I have reviewed the triage nursing documentation and vital signs. Clinical staff has updated patient's PMH/PSHx, current medication list, and drug allergies/intolerances to ensure comprehensive history available to assist in medical decision making.   History:   HPI: Lori Doyle is a 33 y.o. female who presents today with complaints of cough that began about 4 days ago. Patient notes that she will intermittently have clear sputum production. She denies any associated fevers. Yesterday, patient developed, nausea, vomiting, diarrhea, sore throat, myalgias, and a non-specific headache. She is unable to report a Tmax citing the fact that she has not measured her temperature. She has only experienced a few episodes of loose stools and has only vomited once today. Vomiting was post-tussive. Patient is able to eat and drink normally, however appetite has been decreased overall. Patient has been taking Robitussin DM for the past couple of days. Patient denies any close contact with anyone known to be ill. She advises that she has never been testing for SARS-CoV-2 (novel coronavirus).   Of note, patient reports that her employer has been diagnosed with Leptospirosis that was acquired from her pet. Patient has contact with this individual once a week; no contact with the infected animal.   Past Medical History:  Diagnosis Date   Bell's palsy    Chlamydia    Epilepsy (HCC) 2004   In [place]   Genital herpes    Genital warts    Gonorrhea    Seasonal allergies    Seizures (HCC)     Past Surgical History:  Procedure Laterality Date   WISDOM TOOTH EXTRACTION      Family History  Problem Relation Age of Onset   Endometriosis Mother        Uterus    Social  History   Tobacco Use   Smoking status: Current Some Day Smoker    Types: Cigarettes   Smokeless tobacco: Never Used  Substance Use Topics   Alcohol use: Yes   Drug use: Yes    Types: Marijuana, Cocaine    Patient Active Problem List   Diagnosis Date Noted   Herpes simplex vulvovaginitis 03/03/2018   Genital herpes    Seasonal allergies     Home Medications:    Current Meds  Medication Sig   cetirizine (ZYRTEC) 5 MG tablet Take 5 mg by mouth daily.   Probiotic Product (PROBIOTIC DAILY PO) Take 1 capsule by mouth every morning.   valACYclovir (VALTREX) 500 MG tablet Take 1 tablet (500 mg total) by mouth daily.    Allergies:   Penicillins  Review of Systems (ROS): Review of Systems  Constitutional: Positive for appetite change (decreased). Negative for fatigue and fever.  HENT: Positive for sore throat. Negative for congestion, drooling, ear pain, postnasal drip, rhinorrhea, sinus pressure, sinus pain, sneezing and trouble swallowing.   Eyes: Negative for pain, discharge and redness.  Respiratory: Positive for cough. Negative for chest tightness and shortness of breath.   Cardiovascular: Negative for chest pain and palpitations.  Gastrointestinal: Positive for diarrhea, nausea and vomiting. Negative for abdominal pain.  Musculoskeletal: Positive for myalgias. Negative for arthralgias, back pain and neck pain.  Skin: Negative for color change, pallor and rash.  Neurological: Negative for dizziness, syncope, weakness and headaches.  Hematological: Negative for adenopathy.  All other systems reviewed and are negative.    Vital Signs: Today's Vitals   08/12/19 1515 08/12/19 1516 08/12/19 1522 08/12/19 1552  BP:   107/80   Pulse:   66   Resp:   18   Temp:   98.1 F (36.7 C)   TempSrc:   Oral   SpO2:   97%   Weight:  200 lb (90.7 kg)    Height:  5\' 5"  (1.651 m)    PainSc: 7    7     Physical Exam: Physical Exam  Constitutional: She is oriented to  person, place, and time and well-developed, well-nourished, and in no distress. No distress.  HENT:  Head: Normocephalic and atraumatic.  Nose: Nose normal. No mucosal edema or sinus tenderness.  Mouth/Throat: Mucous membranes are normal. Posterior oropharyngeal erythema (clear PND) present. No oropharyngeal exudate or posterior oropharyngeal edema.  Eyes: Pupils are equal, round, and reactive to light. Conjunctivae and EOM are normal.  Neck: Normal range of motion. Neck supple. No tracheal deviation present.  Cardiovascular: Normal rate, regular rhythm, normal heart sounds and intact distal pulses. Exam reveals no gallop and no friction rub.  No murmur heard. Pulmonary/Chest: Effort normal and breath sounds normal. No respiratory distress. She has no wheezes. She has no rales.  Abdominal: Soft. Bowel sounds are normal. She exhibits no distension. There is no abdominal tenderness.  Musculoskeletal: Normal range of motion.  Lymphadenopathy:    She has no cervical adenopathy.  Neurological: She is alert and oriented to person, place, and time. Gait normal. GCS score is 15.  Skin: Skin is warm and dry. No rash noted. She is not diaphoretic.  Psychiatric: Mood, memory, affect and judgment normal.  Nursing note and vitals reviewed.   Urgent Care Treatments / Results:   LABS: PLEASE NOTE: all labs that were ordered this encounter are listed, however only abnormal results are displayed. Labs Reviewed  RAPID STREP SCREEN ([facility] ONLY)  NOVEL CORONAVIRUS, NAA (HOSP ORDER, SEND-OUT TO REF LAB; TAT 18-24 HRS)  CULTURE, GROUP A STREP [facility])    EKG: -None  RADIOLOGY: No results found.  PROCEDURES: Procedures  MEDICATIONS RECEIVED THIS VISIT: Medications - No data to display  PERTINENT CLINICAL COURSE NOTES/UPDATES:   Initial Impression / Assessment and Plan / Urgent Care Course:  Pertinent labs & imaging results that were available during my care of the patient were personally  reviewed by me and considered in my medical decision making (see lab/imaging section of note for values and interpretations).  Lori Doyle is a 33 y.o. female who presents to [facility] today with complaints of Nausea and Cough   Patient overall well appearing and in no acute distress today in clinic. Presenting symptoms (see HPI) and exam as documented above. She presentswith symptoms associated with SARS-CoV-2 (novel coronavirus). Reviewed potential for infection in the midst of pandemic conditions. Patient agrees to testing. Patient collected SARS-CoV-2 swab via facility approved self collection process today under the supervision of certified clinical staff. Discussed variable turn around times associated with testing, as swabs are being processed at [facility], and have been taking as long as 7 days. She was advised to self quarantine, per [facility] DHHS guidelines, until negative results received.   Symptom constellation felt to be of a viral nature rather than a bacterial infection requiring antimicrobial therapy. Doubt Leptospirosis infection as it is generally not transmitted from human to human contact. Again, she has only limited  contact with her infected employer, and no contact with the infected animal. Additionally, cough is not typically associated with Leptospirosis. Cough has been the same for the last 4 days; no fevers. Will provide a supply of benzonatate for PRN use. Patient notes that her most distressing symptom right now is her nausea. Will send in ondansetron to help with this. Dicussed supportive care measures at home during acute phase of illness. Patient to rest as much as possible. She was encouraged to ensure adequate hydration (water and ORS) to prevent dehydration and electrolyte derangements. Patient may use APAP and/or IBU on an as needed basis for pain/fever. Patient to contact the clinic for worsening symptoms, or if she is not improving over the next few days. Will consider  further treatment options if not improving.   Current clinical condition warrants patient being out of work in order to recover from her current injury/illness. She was provided with the appropriate documentation to provide to her place of employment that will allow for her to RTW on 08/14/2019 with no restrictions. RTW is contingent on her SARS-CoV-2 test results being reviewed as negative.    Discussed follow up with primary care physician in 1 week for re-evaluation. I have reviewed the follow up and strict return precautions for any new or worsening symptoms. Patient is aware of symptoms that would be deemed urgent/emergent, and would thus require further evaluation either here or in the emergency department. At the time of discharge, she verbalized understanding and consent with the discharge plan as it was reviewed with her. All questions were fielded by provider and/or clinic staff prior to patient discharge.    Final Clinical Impressions / Urgent Care Diagnoses:   Final diagnoses:  Viral illness  Advice Given About Covid-19 Virus Infection  Non-intractable vomiting with nausea, unspecified vomiting type    New Prescriptions:  [place] Controlled Substance Registry consulted? Not Applicable  Meds ordered this encounter  Medications   ondansetron (ZOFRAN-ODT) 4 MG disintegrating tablet    Sig: Take 1 tablet (4 mg total) by mouth every 8 (eight) hours as needed.    Dispense:  15 tablet    Refill:  0   benzonatate (TESSALON) 100 MG capsule    Sig: Take 1 capsule (100 mg total) by mouth every 8 (eight) hours.    Dispense:  21 capsule    Refill:  0    Recommended Follow up Care:  Patient encouraged to follow up with the following provider within the specified time frame, or sooner as dictated by the severity of her symptoms. As always, she was instructed that for any urgent/emergent care needs, she should seek care either here or in the emergency department for more immediate  evaluation.  Follow-up Information    PCP In 1 week.   Why: General reassessment of symptoms if not improving        NOTE: This note was prepared using Scientist, clinical (histocompatibility and immunogenetics)Dragon dictation software along with smaller Lobbyistphrase technology. Despite my best ability to proofread, there is the potential that transcriptional errors may still occur from this process, and are completely unintentional.     Verlee MonteGray, Bralin Garry E, NP 08/12/19 [phone]

## 2019-08-12 NOTE — Discharge Instructions (Addendum)
It was very nice seeing you today in clinic. Thank you for entrusting me with your care.   REST and increase fluid intake as much as possible. Water is always best, as sugar and caffeine containing fluids can cause you to become dehydrated. Try to incorporate electrolyte enriched fluids, such as Gatorade or Pedialyte, into your daily fluid intake. May use Tylenol and/or Ibuprofen as needed for pain/fever. Warm salt water gargles may help with your sore throat. I will send in some medication for your nausea.  You were tested for SARS-CoV-2 (novel coronavirus) today. Please self quarantine at home until negative results have been received.  Make arrangements to follow up with your regular doctor in 1 week for re-evaluation if not improving. If your symptoms/condition worsens, please seek follow up care either here or in the ER. Please remember, our [facility] providers are "right here with you" when you need [place].   Again, it was my pleasure to take care of you today. Thank you for choosing our clinic. I hope that you start to feel better quickly.   Honor Loh, MSN, APRN, FNP-C, CEN Advanced Practice Provider [facility]

## 2019-08-13 ENCOUNTER — Encounter (HOSPITAL_COMMUNITY): Payer: Self-pay

## 2019-08-13 LAB — NOVEL CORONAVIRUS, NAA (HOSP ORDER, SEND-OUT TO REF LAB; TAT 18-24 HRS): SARS-CoV-2, NAA: NOT DETECTED

## 2019-08-14 LAB — CULTURE, GROUP A STREP (THRC)

## 2019-11-01 ENCOUNTER — Encounter: Payer: Self-pay | Admitting: Emergency Medicine

## 2019-11-01 ENCOUNTER — Ambulatory Visit
Admission: EM | Admit: 2019-11-01 | Discharge: 2019-11-01 | Disposition: A | Discharge status: Home / Self Care | Payer: BLUE CROSS/BLUE SHIELD | Attending: Family Medicine | Admitting: Family Medicine

## 2019-11-01 ENCOUNTER — Other Ambulatory Visit: Payer: Self-pay

## 2019-11-01 DIAGNOSIS — R0981 Nasal congestion: Secondary | ICD-10-CM

## 2019-11-01 DIAGNOSIS — R05 Cough: Secondary | ICD-10-CM | POA: Diagnosis not present

## 2019-11-01 DIAGNOSIS — J069 Acute upper respiratory infection, unspecified: Secondary | ICD-10-CM

## 2019-11-01 DIAGNOSIS — J3489 Other specified disorders of nose and nasal sinuses: Secondary | ICD-10-CM

## 2019-11-01 DIAGNOSIS — Z7189 Other specified counseling: Secondary | ICD-10-CM

## 2019-11-01 NOTE — ED Provider Notes (Signed)
[facility] ____________________________________________  Time seen: Approximately 7:34 PM  I have reviewed the triage vital signs and the nursing notes.   HISTORY  Chief Complaint Cough   HPI Lori Doyle is a 33 y.o. female presenting for evaluation of 4 days of runny nose, nasal congestion, cough and sinus pressure.  Patient reports symptoms started just after visiting her mother who had similar symptoms.  Denies any accompanying fever, sore throat, chest pain, shortness of breath.  Denies changes in taste or smell.  Denies vomiting or diarrhea.  Has continued remain active.  States currently she is feeling much better and feels like she is getting over this, however reports that her work is requiring a work note.  Has been doing herbal supportive care at home, no over-the-counter medications.  Continues to eat and drink well.  Denies other aggravating alleviating factors.  Denies other recent sick contacts.  Patient's last menstrual period was 10/11/2019 (approximate).Denies pregnancy.    Past Medical History:  Diagnosis Date  . Bell's palsy   . Chlamydia   . Epilepsy ([place]) 2004   In [place]  . Genital herpes   . Genital warts   . Gonorrhea   . Seasonal allergies   . Seizures [facility])     Patient Active Problem List   Diagnosis Date Noted  . Herpes simplex vulvovaginitis 03/03/2018  . Genital herpes   . Seasonal allergies     Past Surgical History:  Procedure Laterality Date  . WISDOM TOOTH EXTRACTION       No current facility-administered medications for this encounter.   Current Outpatient Medications:  .  cetirizine (ZYRTEC) 5 MG tablet, Take 5 mg by mouth daily., Disp: , Rfl:  .  benzonatate (TESSALON) 100 MG capsule, Take 1 capsule (100 mg total) by mouth every 8 (eight) hours., Disp: 21 capsule, Rfl: 0 .  ondansetron (ZOFRAN-ODT) 4 MG disintegrating tablet, Take 1 tablet (4 mg total) by mouth every 8 (eight) hours as needed., Disp: 15 tablet, Rfl: 0  .  Probiotic Product (PROBIOTIC DAILY PO), Take 1 capsule by mouth every morning., Disp: , Rfl:  .  valACYclovir (VALTREX) 500 MG tablet, Take 1 tablet (500 mg total) by mouth daily., Disp: 90 tablet, Rfl: 1  Allergies Penicillins  Family History  Problem Relation Age of Onset  . Endometriosis Mother        Uterus    Social History Social History   Tobacco Use  . Smoking status: Current Some Day Smoker    Types: Cigarettes  . Smokeless tobacco: Never Used  Substance Use Topics  . Alcohol use: Yes  . Drug use: Yes    Types: Marijuana, Cocaine    Review of Systems Constitutional: No fever ENT: No sore throat. Positive nasal congestion.  Cardiovascular: Denies chest pain. Respiratory: Denies shortness of breath. Gastrointestinal: No abdominal pain.  No vomiting.  No diarrhea.  Genitourinary: Negative for dysuria. Musculoskeletal: Negative for back pain. Skin: Negative for rash.  ____________________________________________   PHYSICAL EXAM:  VITAL SIGNS: ED Triage Vitals  Enc Vitals Group     BP 11/01/19 1745 119/73     Pulse Rate 11/01/19 1745 (!) 102     Resp 11/01/19 1745 18     Temp 11/01/19 1745 98 F (36.7 C)     Temp Source 11/01/19 1745 Oral     SpO2 11/01/19 1745 99 %     Weight 11/01/19 1742 205 lb (93 kg)     Height 11/01/19 1742 5' 5.5" (  1.664 m)     Head Circumference --      Peak Flow --      Pain Score 11/01/19 1742 6     Pain Loc --      Pain Edu? --      Excl. in GC? --    Constitutional: Alert and oriented. Well appearing and in no acute distress. Eyes: Conjunctivae are normal.  Head: Atraumatic. No sinus tenderness to palpation. No swelling. No erythema.  Ears: no erythema, normal TMs bilaterally.   Nose:Mild nasal congestion  Mouth/Throat: Mucous membranes are moist. No pharyngeal erythema. No tonsillar swelling or exudate.  Neck: No stridor.  No cervical spine tenderness to palpation. Hematological/Lymphatic/Immunilogical: No  cervical lymphadenopathy. Cardiovascular: Normal rate, regular rhythm. Grossly normal heart sounds.  Good peripheral circulation. Respiratory: Normal respiratory effort.  No retractions. No wheezes, rales or rhonchi. Good air movement.  Musculoskeletal: Ambulatory with steady gait.  Neurologic:  Normal speech and language. No gait instability. Skin:  Skin appears warm, dry and intact. No rash noted. Psychiatric: Mood and affect are normal. Speech and behavior are normal.  ___________________________________________   LABS (all labs ordered are listed, but only abnormal results are displayed)  Labs Reviewed  NOVEL CORONAVIRUS, NAA (HOSP ORDER, SEND-OUT TO REF LAB; TAT 18-24 HRS)     PROCEDURES Procedures    INITIAL IMPRESSION / ASSESSMENT AND PLAN / ED COURSE  Pertinent labs & imaging results that were available during my care of the patient were reviewed by me and considered in my medical decision making (see chart for details).  Well-appearing patient.  No acute distress.  Suspect viral upper respiratory infection.  COVID-19 testing completed and advice given.  Encourage rest, fluids, supportive care.  Work note given.  Discussed follow up and return parameters including no resolution or any worsening concerns. Patient verbalized understanding and agreed to plan.   ____________________________________________   FINAL CLINICAL IMPRESSION(S) / ED DIAGNOSES  Final diagnoses:  Viral URI with cough  Advice given about COVID-19 virus infection     ED Discharge Orders    None       Note: This dictation was prepared with Dragon dictation along with smaller phrase technology. Any transcriptional errors that result from this process are unintentional.         Renford Dills, NP 11/01/19 1937

## 2019-11-01 NOTE — Discharge Instructions (Signed)
Rest. Drink plenty of fluids. Monitor.   Follow up with your primary care physician this week as needed. Return to Urgent care for new or worsening concerns.

## 2019-11-01 NOTE — ED Triage Notes (Signed)
Pt c/o cough, sinus congestion, runny nose, headache, sinus pain, pressure.  Started about 3 days ago. She is needing to tested for COVID for work.

## 2019-11-03 LAB — NOVEL CORONAVIRUS, NAA (HOSP ORDER, SEND-OUT TO REF LAB; TAT 18-24 HRS): SARS-CoV-2, NAA: NOT DETECTED

## 2021-02-21 ENCOUNTER — Encounter: Payer: Self-pay | Admitting: Advanced Practice Midwife

## 2021-02-21 ENCOUNTER — Other Ambulatory Visit (HOSPITAL_COMMUNITY)
Admission: RE | Admit: 2021-02-21 | Discharge: 2021-02-21 | Disposition: A | Discharge status: Home / Self Care | Payer: BLUE CROSS/BLUE SHIELD | Source: Ambulatory Visit | Attending: Advanced Practice Midwife | Admitting: Advanced Practice Midwife

## 2021-02-21 ENCOUNTER — Other Ambulatory Visit: Payer: Self-pay

## 2021-02-21 ENCOUNTER — Ambulatory Visit (INDEPENDENT_AMBULATORY_CARE_PROVIDER_SITE_OTHER): Payer: BLUE CROSS/BLUE SHIELD | Admitting: Advanced Practice Midwife

## 2021-02-21 VITALS — BP 143/80 | HR 116 | Ht 65.5 in | Wt 200.2 lb

## 2021-02-21 DIAGNOSIS — Z124 Encounter for screening for malignant neoplasm of cervix: Secondary | ICD-10-CM | POA: Diagnosis not present

## 2021-02-21 DIAGNOSIS — N921 Excessive and frequent menstruation with irregular cycle: Secondary | ICD-10-CM

## 2021-02-21 DIAGNOSIS — Z01419 Encounter for gynecological examination (general) (routine) without abnormal findings: Secondary | ICD-10-CM | POA: Diagnosis present

## 2021-02-21 NOTE — Patient Instructions (Addendum)
Hidradenitis Suppurativa Hidradenitis suppurativa is a long-term (chronic) skin disease. It is similar to a severe form of acne, but it affects areas of the body where acne would be unusual, especially areas of the body where skin rubs against skin and becomes moist. These include:  Underarms.  Groin.  Genital area.  Buttocks.  Upper thighs.  Breasts. Hidradenitis suppurativa may start out as small lumps or pimples caused by blocked sweat glands or hair follicles. Pimples may develop into deep sores that break open (rupture) and drain pus. Over time, affected areas of skin may thicken and become scarred. This condition is rare and does not spread from person to person (non-contagious). What are the causes? The exact cause of this condition is not known. It may be related to:  Female and female hormones.  An overactive disease-fighting system (immune system). The immune system may over-react to blocked hair follicles or sweat glands and cause swelling and pus-filled sores. What increases the risk? You are more likely to develop this condition if you:  Are female.  Are 11-55 years old.  Have a family history of hidradenitis suppurativa.  Have a personal history of acne.  Are overweight.  Smoke.  Take the medicine lithium. What are the signs or symptoms? The first symptoms are usually painful bumps in the skin, similar to pimples. The condition may get worse over time (progress), or it may only cause mild symptoms. If the disease progresses, symptoms may include:  Skin bumps getting bigger and growing deeper into the skin.  Bumps rupturing and draining pus.  Itchy, infected skin.  Skin getting thicker and scarred.  Tunnels under the skin (fistulas) where pus drains from a bump.  Pain during daily activities, such as pain during walking if your groin area is affected.  Emotional problems, such as stress or depression. This condition may affect your appearance and your  ability or willingness to wear certain clothes or do certain activities. How is this diagnosed? This condition is diagnosed by a health care provider who specializes in skin diseases (dermatologist). You may be diagnosed based on:  Your symptoms and medical history.  A physical exam.  Testing a pus sample for infection.  Blood tests. How is this treated? Your treatment will depend on how severe your symptoms are. The same treatment will not work for everybody with this condition. You may need to try several treatments to find what works best for you. Treatment may include:  Cleaning and bandaging (dressing) your wounds as needed.  Lifestyle changes, such as new skin care routines.  Taking medicines, such as: ? Antibiotics. ? Acne medicines. ? Medicines to reduce the activity of the immune system. ? A diabetes medicine (metformin). ? Birth control pills, for women. ? Steroids to reduce swelling and pain.  Working with a mental health care provider, if you experience emotional distress due to this condition. If you have severe symptoms that do not get better with medicine, you may need surgery. Surgery may involve:  Using a laser to clear the skin and remove hair follicles.  Opening and draining deep sores.  Removing the areas of skin that are diseased and scarred. Follow these instructions at home: Medicines  Take over-the-counter and prescription medicines only as told by your health care provider.  If you were prescribed an antibiotic medicine, take it as told by your health care provider. Do not stop taking the antibiotic even if your condition improves.   Skin care  If you have open wounds,   cover them with a clean dressing as told by your health care provider. Keep wounds clean by washing them gently with soap and water when you bathe.  Do not shave the areas where you get hidradenitis suppurativa.  Do not wear deodorant.  Wear loose-fitting clothes.  Try to avoid  getting overheated or sweaty. If you get sweaty or wet, change into clean, dry clothes as soon as you can.  To help relieve pain and itchiness, cover sore areas with a warm, clean washcloth (warm compress) for 5-10 minutes as often as needed.  If told by your health care provider, take a bleach bath twice a week: ? Fill your bathtub halfway with water. ? Pour in  cup of unscented household bleach. ? Soak in the tub for 5-10 minutes. ? Only soak from the neck down. Avoid water on your face and hair. ? Shower to rinse off the bleach from your skin. General instructions  Learn as much as you can about your disease so that you have an active role in your treatment. Work closely with your health care provider to find treatments that work for you.  If you are overweight, work with your health care provider to lose weight as recommended.  Do not use any products that contain nicotine or tobacco, such as cigarettes and e-cigarettes. If you need help quitting, ask your health care provider.  If you struggle with living with this condition, talk with your health care provider or work with a mental health care provider as recommended.  Keep all follow-up visits as told by your health care provider. This is important. Where to find more information  Hidradenitis Suppurativa Foundation, Inc.: https://www.hs-foundation.org/  American Academy of Dermatology: https://www.aad.org Contact a health care provider if you have:  A flare-up of hidradenitis suppurativa.  A fever or chills.  Trouble controlling your symptoms at home.  Trouble doing your daily activities because of your symptoms.  Trouble dealing with emotional problems related to your condition. Summary  Hidradenitis suppurativa is a long-term (chronic) skin disease. It is similar to a severe form of acne, but it affects areas of the body where acne would be unusual.  The first symptoms are usually painful bumps in the skin, similar  to pimples. The condition may only cause mild symptoms, or it may get worse over time (progress).  If you have open wounds, cover them with a clean dressing as told by your health care provider. Keep wounds clean by washing them gently with soap and water when you bathe.  Besides skin care, treatment may include medicines, laser treatment, and surgery. This information is not intended to replace advice given to you by your health care provider. Make sure you discuss any questions you have with your health care provider. Document Revised: 09/26/2020 Document Reviewed: 09/26/2020 Elsevier Patient Education  2021 [employer].  

## 2021-02-21 NOTE — Progress Notes (Signed)
Gynecology Annual Exam   PCP: Patient, No Pcp Per  Chief Complaint:  Chief Complaint  Patient presents with  . Gynecologic Exam    Annual - irregular menstrual periods with heavy bleeding/clots, also cramping. RM 3    History of Present Illness: Patient is a 35 y.o. G0P0000 presents for annual exam. The patient has complaints today of irregular- every 30-45 day- menstrual cycles, that for the past 3 months have been heavy- changing a pad every 2 hours, and with clots and strong cramping. She has no known history of fibroids. She is not currently sexually active and has no concerns for STDs. She takes valtrex episodically and has not had an outbreak for 2 years. She mentions some bumps on her upper/inner thighs that are relatively new. They are painful and never come to a head or break open. She wonders if they are a lymph response. Her last PAP smear was normal in 2019.   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 She admits her diet is improving with less sugar. She does not drink much water and is trying to increase. She usually gets 5-6 hours of sleep. She does not get much exercise.   We discussed hormonal regulation of cycles and other comfort measures for heavy bleeding and doing an ultrasound to rule out fibroids. She declines hormonal regulation at this time. She is interested in having her reproductive hormone levels checked today.    LMP: Patient's last menstrual period was 02/11/2021.   The patient is not currently sexually active. She currently uses none for contraception. She denies dyspareunia.  The patient does perform self breast exams.  There is no notable family history of breast or ovarian cancer in her family.  The patient wears seatbelts: yes.     The patient denies current symptoms of depression. She admits marijuana use for coping. She smokes 4-6 cigarettes per day and is not ready to quit- "maybe someday".    Review of Systems: Review of Systems  Constitutional: Negative for chills and fever.  HENT: Negative for congestion, ear discharge, ear pain, hearing loss, sinus pain and sore throat.   Eyes: Negative for blurred vision and double vision.  Respiratory: Negative for cough, shortness of breath and wheezing.   Cardiovascular: Negative for chest pain, palpitations and leg swelling.  Gastrointestinal: Negative for abdominal pain, blood in stool, constipation, diarrhea, heartburn, melena, nausea and vomiting.  Genitourinary: Negative for dysuria, flank pain, frequency, hematuria and urgency.       Positive for heavy, irregular menstrual bleeding with cramping  Musculoskeletal: Positive for back pain. Negative for joint pain and myalgias.  Skin: Negative for itching and rash.       Positive for bumps on upper thighs  Neurological: Negative for dizziness, tingling, tremors, sensory change, speech change, focal weakness, seizures, loss of consciousness, weakness and headaches.  Endo/Heme/Allergies: Positive for environmental allergies. Does not bruise/bleed easily.  Psychiatric/Behavioral: Negative for depression, hallucinations, memory loss, substance abuse and suicidal ideas. The patient is not nervous/anxious and does not have insomnia.     Past Medical History:  Patient Active Problem List   Diagnosis Date Noted  . Herpes simplex vulvovaginitis 03/03/2018  . Genital herpes   . Seasonal allergies     Past Surgical History:  Past Surgical History:  Procedure Laterality Date  . WISDOM TOOTH EXTRACTION      Gynecologic History:  Patient's last menstrual period was 02/11/2021. Contraception:  none Last Pap: 2019 Results were: no abnormalities   Obstetric History: G0P0000  Family History:  Family History  Problem Relation Age of Onset  . Endometriosis Mother        Uterus  . Diabetes Maternal Grandfather     Social History:  Social History   Socioeconomic History  . Marital  status: Single    Spouse name: Not on file  . Number of children: Not on file  . Years of education: Not on file  . Highest education level: Not on file  Occupational History  . Not on file  Tobacco Use  . Smoking status: Current Some Day Smoker    Types: Cigarettes  . Smokeless tobacco: Never Used  Vaping Use  . Vaping Use: Never used  Substance and Sexual Activity  . Alcohol use: Yes  . Drug use: Yes    Types: Marijuana, Cocaine  . Sexual activity: Not Currently    Birth control/protection: None  Other Topics Concern  . Not on file  Social History Narrative  . Not on file   Social Determinants of Health   Financial Resource Strain: Not on file  Food Insecurity: Not on file  Transportation Needs: Not on file  Physical Activity: Not on file  Stress: Not on file  Social Connections: Not on file  Intimate Partner Violence: Not on file    Allergies:  Allergies  Allergen Reactions  . Penicillins Hives    Medications: Prior to Admission medications   Medication Sig Start Date End Date Taking? Authorizing Provider  cetirizine (ZYRTEC) 5 MG tablet Take 5 mg by mouth daily.   Yes [provider]  tiZANidine (ZANAFLEX) 4 MG tablet Take 4 mg by mouth 2 (two) times daily as needed. 02/05/21   [provider]  valACYclovir (VALTREX) 500 MG tablet Take 1 tablet (500 mg total) by mouth daily. 11/03/18   CoplandIlona Sorrel, PA-C  ESTARYLLA 0.25-35 MG-MCG tablet TAKE 1 TABLET BY MOUTH DAILY 04/19/19 08/12/19  Copland, Ilona Sorrel, PA-C    Physical Exam Vitals: Blood pressure (!) 143/80, pulse (!) 116, height 5' 5.5" (1.664 m), weight 200 lb 4 oz (90.8 kg), last menstrual period 02/11/2021.  General: NAD HEENT: normocephalic, anicteric Thyroid: no enlargement, no palpable nodules Pulmonary: No increased work of breathing, CTAB Cardiovascular: RRR, distal pulses 2+ Breast: Breast symmetrical, no tenderness, no palpable nodules or masses, no skin or nipple retraction  present, no nipple discharge.  No axillary or supraclavicular lymphadenopathy. Abdomen: NABS, soft, non-tender, non-distended.  Umbilicus without lesions.  No hepatomegaly, splenomegaly or masses palpable. No evidence of hernia  Genitourinary:  External: Normal external female genitalia.  Normal urethral meatus, normal Bartholin's and Skene's glands.    Vagina: Normal vaginal mucosa, no evidence of prolapse.    Cervix: Grossly normal in appearance, no bleeding, no CMT  Uterus: Non-enlarged, mobile, normal contour.    Adnexa: ovaries non-enlarged, no adnexal masses  Rectal: deferred  Lymphatic: no evidence of inguinal lymphadenopathy, superficial red bumps on upper inner thighs that appear to be boils/possible ingrown hairs Extremities: no edema, erythema, or tenderness Neurologic: Grossly intact Psychiatric: mood appropriate, affect full   Assessment: 35 y.o. G0P0000 routine annual exam  Plan: Problem List Items Addressed This Visit   None   Visit Diagnoses    Well woman exam with routine gynecological exam    -  Primary   Relevant Orders   FSH/LH   Estradiol   Progesterone   US PELVIS TRANSVAGINAL NON-OB (TV ONLY)  Cytology - PAP   Screening for cervical cancer       Relevant Orders   Cytology - PAP   Menorrhagia with irregular cycle       Relevant Orders   FSH/LH   Estradiol   Progesterone   US PELVIS TRANSVAGINAL NON-OB (TV ONLY)      1) STI screening  was offered and declined  2)  ASCCP guidelines and rationale discussed.  Patient opts for every 3 years screening interval  3) Contraception - the patient is currently using  none.  She is happy with her current form of contraception and plans to continue  4) Routine healthcare maintenance including cholesterol, diabetes screening: declines. Will check Estradiol, Progesterone, FSH/LH  5) Increase healthy lifestyle; hydration, exercise, sleep  6) Return in about 1 week (around 02/28/2021) for gyn u/s and follow  up.   Tresea Mall, CNM [facility] 02/21/2021, 10:54 AM

## 2021-02-22 LAB — FSH/LH
FSH: 4.4 m[IU]/mL
LH: 12.1 m[IU]/mL

## 2021-02-22 LAB — ESTRADIOL: Estradiol: 43 pg/mL

## 2021-02-22 LAB — PROGESTERONE: Progesterone: 0.2 ng/mL

## 2021-02-23 LAB — CYTOLOGY - PAP
Comment: NEGATIVE
Diagnosis: NEGATIVE
High risk HPV: NEGATIVE

## 2021-03-15 ENCOUNTER — Ambulatory Visit: Payer: BLUE CROSS/BLUE SHIELD

## 2021-03-15 ENCOUNTER — Ambulatory Visit: Payer: BLUE CROSS/BLUE SHIELD | Admitting: Advanced Practice Midwife

## 2021-03-15 DIAGNOSIS — N921 Excessive and frequent menstruation with irregular cycle: Secondary | ICD-10-CM

## 2021-03-15 DIAGNOSIS — Z01419 Encounter for gynecological examination (general) (routine) without abnormal findings: Secondary | ICD-10-CM

## 2021-05-11 ENCOUNTER — Encounter: Payer: Self-pay | Admitting: Advanced Practice Midwife

## 2021-05-11 ENCOUNTER — Other Ambulatory Visit: Payer: Self-pay

## 2021-05-11 ENCOUNTER — Ambulatory Visit: Payer: BLUE CROSS/BLUE SHIELD | Admitting: Advanced Practice Midwife

## 2021-05-11 VITALS — BP 132/78 | Ht 65.5 in | Wt 196.0 lb

## 2021-05-11 DIAGNOSIS — Z09 Encounter for follow-up examination after completed treatment for conditions other than malignant neoplasm: Secondary | ICD-10-CM | POA: Diagnosis not present

## 2021-05-11 NOTE — Progress Notes (Signed)
Patient ID: Lori Doyle, female   DOB: 1986-01-03, 35 y.o.   MRN: [id number]  Reason for Consult: Follow-up ([place] ([facility]) RM 4)   Subjective:  HPI:  Lori Doyle is a 35 y.o. female in the office for gyn ultrasound follow up. We reviewed the report of essentially normal gyn ultrasound and discussed consult as needed with MD to discuss fertility concerns. She has been unable to conceive in the past 15 years despite not using birth control. She denies any other concerns today. She declines hormone treatment at this time for ongoing heavy and frequent menstrual periods.  Past Medical History:  Diagnosis Date  . Bell's palsy   . Chlamydia   . Epilepsy (HCC) 2004   In [place]  . Genital herpes   . Genital warts   . Gonorrhea   . Seasonal allergies   . Seizures (HCC)    Family History  Problem Relation Age of Onset  . Endometriosis Mother        Uterus  . Diabetes Maternal Grandfather    Past Surgical History:  Procedure Laterality Date  . WISDOM TOOTH EXTRACTION      Short Social History:  Social History   Tobacco Use  . Smoking status: Current Some Day Smoker    Types: Cigarettes  . Smokeless tobacco: Never Used  Substance Use Topics  . Alcohol use: Yes    Allergies  Allergen Reactions  . Penicillins Hives    Current Outpatient Medications  Medication Sig Dispense Refill  . cetirizine (ZYRTEC) 5 MG tablet Take 5 mg by mouth daily.    Marland Kitchen tiZANidine (ZANAFLEX) 4 MG tablet Take 4 mg by mouth 2 (two) times daily as needed.    . valACYclovir (VALTREX) 500 MG tablet Take 1 tablet (500 mg total) by mouth daily. 90 tablet 1   No current facility-administered medications for this visit.    Review of Systems  Constitutional: Negative for chills and fever.  HENT: Negative for congestion, ear discharge, ear pain, hearing loss, sinus pain and sore throat.   Eyes: Negative for blurred vision and double vision.  Respiratory: Negative for cough, shortness of breath and wheezing.    Cardiovascular: Negative for chest pain, palpitations and leg swelling.  Gastrointestinal: Negative for abdominal pain, blood in stool, constipation, diarrhea, heartburn, melena, nausea and vomiting.  Genitourinary: Negative for dysuria, flank pain, frequency, hematuria and urgency.  Musculoskeletal: Negative for back pain, joint pain and myalgias.  Skin: Negative for itching and rash.  Neurological: Negative for dizziness, tingling, tremors, sensory change, speech change, focal weakness, seizures, loss of consciousness, weakness and headaches.  Endo/Heme/Allergies: Negative for environmental allergies. Does not bruise/bleed easily.  Psychiatric/Behavioral: Negative for depression, hallucinations, memory loss, substance abuse and suicidal ideas. The patient is not nervous/anxious and does not have insomnia.         Objective:  Objective   Vitals:   05/11/21 1058  BP: 132/78  Weight: 196 lb (88.9 kg)  Height: 5' 5.5" (1.664 m)   Body mass index is 32.12 kg/m. Constitutional: Well nourished, well developed female in no acute distress.  HEENT: normal Skin: Warm and dry.  Cardiovascular: Regular rate and rhythm.   Extremity: no edema  Respiratory: Clear to auscultation bilateral. Normal respiratory effort Neuro: DTRs 2+, Cranial nerves grossly intact Psych: Alert and Oriented x3. No memory deficits. Normal mood and affect.  MS: normal gait, normal bilateral lower extremity ROM/strength/stability.   Procedure Note  Olinger, Asa Lente, MD - 05/09/2021  Formatting of this note might be different from the original.  EXAM: [place] ENDOVAGINAL (NON-OB)  DATE: 05/09/2021 1:12 PM  ACCESSION: [id number]  DICTATED: 05/09/2021 1:13 PM  INTERPRETATION LOCATION: [facility]   CLINICAL INDICATION: 35 years old Female with menorrhagia - N92.1 - Breakthrough bleeding    TECHNIQUE: Ultrasound views of the pelvis were obtained endovaginally using gray scale and color Doppler  imaging.   COMPARISON: None   FINDINGS:   UTERUS/CERVIX: The uterus is anteverted and retroflexed. No focal myometrial mass was seen. The endometrium was normal in thickness although evaluation is somewhat limited due to positioning of the uterus.   OVARIES: The ovaries were seen well transvaginally. Small cystic areas were seen within both ovaries compatible with follicles. There is a 1.2 x 1.3 x 1.3 cm dominant follicle in the right ovary. Appropriate flow was documented on color Doppler imaging.   OTHER: No abnormal pelvic free fluid.   Please see below for data measurements:  LMP: 04/28/2021  Uterus: Sagittal 7.0 cm; AP 3.8 cm; Transverse 2.8 cm  Endometrium: 0.58(cm) cm  Right ovary: Sagittal 1.9 cm; AP 2.6 cm; Transverse 2.0 cm  Left ovary: Sagittal 2.8 cm; AP 2.5 cm; Transverse 2.2 cm    IMPRESSION:  --Unremarkable pelvic ultrasound. Exam End: 05/09/21 1:12 PM   Specimen Collected: 05/09/21 1:13 PM Last Resulted: 05/09/21 2:56 PM  Received From: [facility]  Result Received: 05/11/21 10:56 AM        Assessment/Plan:     35 y.o. G0 female with normal gyn ultrasound- uterus is anteverted and retroflexed  Follow up PRN and with [facility] if desired to discuss fertility concerns   Tresea Mall CNM [facility] 05/11/2021, 1:46 PM

## 2021-05-16 ENCOUNTER — Other Ambulatory Visit: Payer: Self-pay | Admitting: Obstetrics & Gynecology

## 2021-11-28 ENCOUNTER — Encounter: Payer: Self-pay | Admitting: Advanced Practice Midwife

## 2021-11-29 ENCOUNTER — Other Ambulatory Visit: Payer: Self-pay

## 2021-11-29 DIAGNOSIS — A6004 Herpesviral vulvovaginitis: Secondary | ICD-10-CM

## 2021-11-29 MED ORDER — VALACYCLOVIR HCL 500 MG PO TABS: 500.0000 mg | ORAL_TABLET | Freq: Every day | ORAL | 1 refills | Status: AC

## 2023-12-01 ENCOUNTER — Other Ambulatory Visit: Payer: Self-pay | Admitting: Advanced Practice Midwife

## 2023-12-01 DIAGNOSIS — A6004 Herpesviral vulvovaginitis: Secondary | ICD-10-CM
